# Patient Record
Sex: Male | Born: 1965 | Race: White | Hispanic: No | Marital: Married | Smoking: Never smoker
Health system: Southern US, Community
[De-identification: ages and names within clinical notes are randomized; demographics above are authoritative.]

## PROBLEM LIST (undated history)

## (undated) DIAGNOSIS — K5790 Diverticulosis of intestine, part unspecified, without perforation or abscess without bleeding: Secondary | ICD-10-CM

## (undated) DIAGNOSIS — R51 Headache: Secondary | ICD-10-CM

## (undated) DIAGNOSIS — Z87820 Personal history of traumatic brain injury: Secondary | ICD-10-CM

## (undated) DIAGNOSIS — E291 Testicular hypofunction: Secondary | ICD-10-CM

## (undated) DIAGNOSIS — R519 Headache, unspecified: Secondary | ICD-10-CM

## (undated) DIAGNOSIS — E119 Type 2 diabetes mellitus without complications: Secondary | ICD-10-CM

## (undated) DIAGNOSIS — J309 Allergic rhinitis, unspecified: Secondary | ICD-10-CM

## (undated) DIAGNOSIS — K7581 Nonalcoholic steatohepatitis (NASH): Secondary | ICD-10-CM

## (undated) DIAGNOSIS — E785 Hyperlipidemia, unspecified: Secondary | ICD-10-CM

## (undated) DIAGNOSIS — K219 Gastro-esophageal reflux disease without esophagitis: Secondary | ICD-10-CM

## (undated) DIAGNOSIS — K760 Fatty (change of) liver, not elsewhere classified: Secondary | ICD-10-CM

## (undated) DIAGNOSIS — N529 Male erectile dysfunction, unspecified: Secondary | ICD-10-CM

## (undated) DIAGNOSIS — G8929 Other chronic pain: Secondary | ICD-10-CM

## (undated) DIAGNOSIS — N401 Enlarged prostate with lower urinary tract symptoms: Secondary | ICD-10-CM

## (undated) DIAGNOSIS — N138 Other obstructive and reflux uropathy: Secondary | ICD-10-CM

## (undated) HISTORY — DX: Gastro-esophageal reflux disease without esophagitis: K21.9

## (undated) HISTORY — DX: Headache, unspecified: R51.9

## (undated) HISTORY — DX: Testicular hypofunction: E29.1

## (undated) HISTORY — DX: Allergic rhinitis, unspecified: J30.9

## (undated) HISTORY — DX: Headache: R51

## (undated) HISTORY — DX: Benign prostatic hyperplasia with lower urinary tract symptoms: N40.1

## (undated) HISTORY — DX: Diverticulosis of intestine, part unspecified, without perforation or abscess without bleeding: K57.90

## (undated) HISTORY — DX: Other obstructive and reflux uropathy: N13.8

## (undated) HISTORY — DX: Nonalcoholic steatohepatitis (NASH): K75.81

## (undated) HISTORY — DX: Hyperlipidemia, unspecified: E78.5

## (undated) HISTORY — DX: Personal history of traumatic brain injury: Z87.820

## (undated) HISTORY — DX: Other chronic pain: G89.29

## (undated) HISTORY — PX: MYELOGRAM: SHX5347

## (undated) HISTORY — DX: Male erectile dysfunction, unspecified: N52.9

## (undated) HISTORY — PX: NASAL SINUS SURGERY: SHX719

---

## 2005-01-09 ENCOUNTER — Ambulatory Visit: Payer: Self-pay | Admitting: Family Medicine

## 2005-05-05 ENCOUNTER — Other Ambulatory Visit: Payer: Self-pay

## 2005-05-05 ENCOUNTER — Emergency Department: Payer: Self-pay | Admitting: Internal Medicine

## 2006-01-09 ENCOUNTER — Ambulatory Visit: Payer: Self-pay | Admitting: Otolaryngology

## 2007-11-14 ENCOUNTER — Ambulatory Visit: Payer: Self-pay | Admitting: Internal Medicine

## 2008-03-31 ENCOUNTER — Ambulatory Visit: Payer: Self-pay | Admitting: Family Medicine

## 2008-07-17 ENCOUNTER — Ambulatory Visit: Payer: Self-pay | Admitting: Family Medicine

## 2009-06-10 HISTORY — PX: LIVER BIOPSY: SHX301

## 2012-01-06 DIAGNOSIS — E119 Type 2 diabetes mellitus without complications: Secondary | ICD-10-CM | POA: Insufficient documentation

## 2012-01-06 DIAGNOSIS — E781 Pure hyperglyceridemia: Secondary | ICD-10-CM | POA: Insufficient documentation

## 2012-01-07 DIAGNOSIS — E881 Lipodystrophy, not elsewhere classified: Secondary | ICD-10-CM | POA: Insufficient documentation

## 2012-01-07 DIAGNOSIS — K760 Fatty (change of) liver, not elsewhere classified: Secondary | ICD-10-CM | POA: Insufficient documentation

## 2012-01-07 DIAGNOSIS — K219 Gastro-esophageal reflux disease without esophagitis: Secondary | ICD-10-CM | POA: Insufficient documentation

## 2012-06-02 ENCOUNTER — Ambulatory Visit: Payer: Self-pay | Admitting: Internal Medicine

## 2012-06-15 LAB — CBC CANCER CENTER
Basophil #: 0 x10 3/mm (ref 0.0–0.1)
Basophil %: 0.5 %
HGB: 16.1 g/dL (ref 13.0–18.0)
Lymphocyte %: 31.9 %
MCH: 27.6 pg (ref 26.0–34.0)
MCHC: 33 g/dL (ref 32.0–36.0)
MCV: 84 fL (ref 80–100)
Monocyte #: 0.5 x10 3/mm (ref 0.2–1.0)
Neutrophil %: 47.8 %
Platelet: 162 x10 3/mm (ref 150–440)
RBC: 5.81 10*6/uL (ref 4.40–5.90)
RDW: 14.6 % — ABNORMAL HIGH (ref 11.5–14.5)
WBC: 4.9 x10 3/mm (ref 3.8–10.6)

## 2012-06-15 LAB — COMPREHENSIVE METABOLIC PANEL
Albumin: 4 g/dL (ref 3.4–5.0)
Anion Gap: 11 (ref 7–16)
Bilirubin,Total: 0.4 mg/dL (ref 0.2–1.0)
Calcium, Total: 8.5 mg/dL (ref 8.5–10.1)
Chloride: 103 mmol/L (ref 98–107)
Co2: 28 mmol/L (ref 21–32)
Creatinine: 1.25 mg/dL (ref 0.60–1.30)
EGFR (African American): >60
Total Protein: 7.5 g/dL (ref 6.4–8.2)

## 2012-06-15 LAB — IRON AND TIBC
Iron Saturation: 17 %
Unbound Iron-Bind.Cap.: 368 ug/dL

## 2012-06-15 LAB — FERRITIN: Ferritin (ARMC): 13 ng/mL (ref 8–388)

## 2012-06-15 LAB — LACTATE DEHYDROGENASE: LDH: 146 U/L (ref 87–241)

## 2012-06-21 ENCOUNTER — Ambulatory Visit: Payer: Self-pay | Admitting: Internal Medicine

## 2012-07-06 LAB — CBC CANCER CENTER
Basophil #: 0 x10 3/mm (ref 0.0–0.1)
Basophil %: 1 %
Eosinophil #: 0.1 x10 3/mm (ref 0.0–0.7)
Eosinophil %: 2.8 %
HCT: 48.1 % (ref 40.0–52.0)
HGB: 15.7 g/dL (ref 13.0–18.0)
Lymphocyte %: 32.3 %
MCH: 27.2 pg (ref 26.0–34.0)
Monocyte #: 0.4 x10 3/mm (ref 0.2–1.0)
Neutrophil %: 56.3 %
Platelet: 147 x10 3/mm — ABNORMAL LOW (ref 150–440)
RBC: 5.76 10*6/uL (ref 4.40–5.90)
WBC: 5.1 x10 3/mm (ref 3.8–10.6)

## 2012-07-22 ENCOUNTER — Ambulatory Visit: Payer: Self-pay | Admitting: Internal Medicine

## 2012-08-24 ENCOUNTER — Ambulatory Visit: Payer: Self-pay | Admitting: Internal Medicine

## 2012-08-24 LAB — DIFFERENTIAL
Basophil #: 0 10*3/uL (ref 0.0–0.1)
Eosinophil #: 0.1 10*3/uL (ref 0.0–0.7)
Lymphocyte #: 1.7 10*3/uL (ref 1.0–3.6)
Lymphocyte %: 32.9 %
Monocyte #: 0.4 x10 3/mm (ref 0.2–1.0)
Neutrophil #: 3 10*3/uL (ref 1.4–6.5)
Neutrophil %: 57.2 %

## 2012-09-21 ENCOUNTER — Ambulatory Visit: Payer: Self-pay | Admitting: Internal Medicine

## 2012-11-08 DIAGNOSIS — IMO0002 Reserved for concepts with insufficient information to code with codable children: Secondary | ICD-10-CM | POA: Insufficient documentation

## 2013-07-13 ENCOUNTER — Ambulatory Visit: Payer: Self-pay | Admitting: Family Medicine

## 2013-07-13 LAB — CBC WITH DIFFERENTIAL/PLATELET
Basophil %: 0.8 %
Eosinophil #: 0.2 10*3/uL (ref 0.0–0.7)
Eosinophil %: 1.9 %
HGB: 17.6 g/dL (ref 13.0–18.0)
Lymphocyte #: 1.6 10*3/uL (ref 1.0–3.6)
Lymphocyte %: 20.4 %
MCH: 27.6 pg (ref 26.0–34.0)
MCHC: 33.4 g/dL (ref 32.0–36.0)
Monocyte #: 0.8 x10 3/mm (ref 0.2–1.0)
Neutrophil #: 5.4 10*3/uL (ref 1.4–6.5)
Neutrophil %: 67.5 %
Platelet: 167 10*3/uL (ref 150–440)
RBC: 6.37 10*6/uL — ABNORMAL HIGH (ref 4.40–5.90)
WBC: 8 10*3/uL (ref 3.8–10.6)

## 2013-07-13 LAB — MONONUCLEOSIS SCREEN: Mono Test: NEGATIVE

## 2013-07-15 LAB — BETA STREP CULTURE(ARMC)

## 2013-07-17 ENCOUNTER — Ambulatory Visit: Payer: Self-pay | Admitting: Emergency Medicine

## 2014-03-29 DIAGNOSIS — E559 Vitamin D deficiency, unspecified: Secondary | ICD-10-CM | POA: Insufficient documentation

## 2014-10-13 DIAGNOSIS — M791 Myalgia, unspecified site: Secondary | ICD-10-CM | POA: Insufficient documentation

## 2015-04-18 DIAGNOSIS — Z79899 Other long term (current) drug therapy: Secondary | ICD-10-CM | POA: Insufficient documentation

## 2015-06-26 ENCOUNTER — Other Ambulatory Visit: Payer: Self-pay

## 2015-06-26 DIAGNOSIS — E291 Testicular hypofunction: Secondary | ICD-10-CM

## 2015-06-27 ENCOUNTER — Telehealth: Payer: Self-pay

## 2015-06-27 LAB — HEMATOCRIT: Hematocrit: 52.9 % — ABNORMAL HIGH (ref 37.5–51.0)

## 2015-06-27 LAB — TESTOSTERONE: TESTOSTERONE: 360 ng/dL (ref 348–1197)

## 2015-06-27 NOTE — Telephone Encounter (Signed)
-----   Message from Shannon A McGowan, PA-C sent at 06/27/2015  8:05 AM EDT ----- Patient's HCT is elevated.  He needs to have a therapeutic phlebotomy. 

## 2015-06-27 NOTE — Telephone Encounter (Signed)
LMOM

## 2015-06-27 NOTE — Telephone Encounter (Signed)
-----   Message from Harle BattiestShannon A McGowan, PA-C sent at 06/27/2015  8:05 AM EDT ----- Patient's HCT is elevated.  He needs to have a therapeutic phlebotomy.

## 2015-06-27 NOTE — Telephone Encounter (Signed)
Pt returned call. Made aware of lab results. Pt is going to give blood. Pt also asked if testosterone could be called into [facility] in [place]. Please advise. Cw,lpn

## 2015-06-28 ENCOUNTER — Ambulatory Visit
Admission: RE | Admit: 2015-06-28 | Discharge: 2015-06-28 | Disposition: A | Discharge status: Home / Self Care | Payer: BC Managed Care – PPO | Source: Ambulatory Visit | Attending: Urology | Admitting: Urology

## 2015-06-28 DIAGNOSIS — R7889 Finding of other specified substances, not normally found in blood: Secondary | ICD-10-CM | POA: Insufficient documentation

## 2015-06-28 LAB — HEMOGLOBIN AND HEMATOCRIT, BLOOD
HCT: 55.4 % — ABNORMAL HIGH (ref 40.0–52.0)
Hemoglobin: 18.3 g/dL — ABNORMAL HIGH (ref 13.0–18.0)

## 2015-06-28 NOTE — Telephone Encounter (Signed)
He cannot be on testosterone until his HCT is normalized.

## 2015-06-29 NOTE — Telephone Encounter (Signed)
Spoke with Jeffrey Cook in reference to testosterone. Jeffrey Cook voiced understanding. Jeffrey Cook stated he went to therapy yesterday and we should have labs results today. Made Jeffrey Cook aware once lab results are in and [name] gives permission his testosterone will be called in. Jeffrey Cook voiced understanding. Cw,lpn

## 2015-07-02 ENCOUNTER — Other Ambulatory Visit: Payer: Self-pay | Admitting: Family Medicine

## 2015-07-02 DIAGNOSIS — R718 Other abnormality of red blood cells: Secondary | ICD-10-CM

## 2015-07-03 ENCOUNTER — Other Ambulatory Visit: Payer: Self-pay

## 2015-07-09 ENCOUNTER — Ambulatory Visit
Admission: RE | Admit: 2015-07-09 | Discharge: 2015-07-09 | Disposition: A | Discharge status: Home / Self Care | Payer: BC Managed Care – PPO | Source: Ambulatory Visit | Attending: Urology | Admitting: Urology

## 2015-07-09 LAB — HEMOGLOBIN AND HEMATOCRIT, BLOOD
HCT: 48.4 % (ref 40.0–52.0)
Hemoglobin: 16.2 g/dL (ref 13.0–18.0)

## 2015-07-10 ENCOUNTER — Telehealth: Payer: Self-pay

## 2015-07-10 DIAGNOSIS — E291 Testicular hypofunction: Secondary | ICD-10-CM

## 2015-07-10 MED ORDER — TESTOSTERONE CYPIONATE 200 MG/ML IM SOLN: 200.0000 mg | INTRAMUSCULAR | Status: DC

## 2015-07-10 NOTE — Telephone Encounter (Signed)
-----   Message from Harle BattiestShannon A McGowan, PA-C sent at 07/09/2015  7:02 PM EDT ----- He needs to reduce his testosterone dose to 0.5cc weekly. We will recheck his  testosterone level before 9:00 am in one month. Okay to refill his testosterone at this time.

## 2015-07-10 NOTE — Telephone Encounter (Signed)
-----   Message from Shannon A McGowan, PA-C sent at 07/09/2015  7:02 PM EDT ----- He needs to reduce his testosterone dose to 0.5cc weekly. We will recheck his  testosterone level before 9:00 am in one month. Okay to refill his testosterone at this time. 

## 2015-07-10 NOTE — Telephone Encounter (Signed)
LMOM

## 2015-07-10 NOTE — Telephone Encounter (Signed)
Spoke with pt in reference to testosterone. Pt was not happy about coming to clinic for injections. New script was sent to pt pharmacy. Pt will return to clinic next week for injection.

## 2015-07-11 ENCOUNTER — Telehealth: Payer: Self-pay

## 2015-07-11 NOTE — Telephone Encounter (Signed)
Pt called stating he has been taking 0.705mL of testosterone for the past 3 months. Pt asked does he need to go down in dose, since you ordered 0.5 yesterday, or should he stay at the same dose. Please advise.

## 2015-07-11 NOTE — Telephone Encounter (Signed)
His dose will be 0.5 mL IM every 2 weeks.  This medication will need to be administered to the patient by us in the office.  This is in accordance with the states efforts to reduce the abuse of testosterone.

## 2015-07-12 NOTE — Telephone Encounter (Signed)
Spoke with pt and made aware of testosterone dosage and the need to come to the office for future injections. Pt voiced understanding.

## 2015-07-23 ENCOUNTER — Ambulatory Visit
Admission: EM | Admit: 2015-07-23 | Discharge: 2015-07-23 | Disposition: A | Discharge status: Home / Self Care | Payer: BC Managed Care – PPO | Attending: Family Medicine | Admitting: Family Medicine

## 2015-07-23 ENCOUNTER — Encounter: Payer: Self-pay | Admitting: Emergency Medicine

## 2015-07-23 DIAGNOSIS — J0100 Acute maxillary sinusitis, unspecified: Secondary | ICD-10-CM

## 2015-07-23 HISTORY — DX: Type 2 diabetes mellitus without complications: E11.9

## 2015-07-23 HISTORY — DX: Fatty (change of) liver, not elsewhere classified: K76.0

## 2015-07-23 MED ORDER — AMOXICILLIN-POT CLAVULANATE 875-125 MG PO TABS: 1.0000 | ORAL_TABLET | Freq: Two times a day (BID) | ORAL | Status: DC

## 2015-07-23 MED ORDER — FEXOFENADINE-PSEUDOEPHED ER 180-240 MG PO TB24: 1.0000 | ORAL_TABLET | Freq: Every day | ORAL | Status: DC

## 2015-07-23 MED ORDER — FLUTICASONE PROPIONATE 50 MCG/ACT NA SUSP: 2.0000 | Freq: Every day | NASAL | Status: DC

## 2015-07-23 NOTE — ED Notes (Signed)
Patient c/o cough, sinus pain and pressure since Wed.  Patient denies fevers.

## 2015-07-23 NOTE — Discharge Instructions (Signed)
Facial Infection You have an infection of your face. This requires special attention to help prevent serious problems. Infections in facial wounds can cause poor healing and scars. They can also spread to deeper tissues, especially around the eye. Wound and dental infections can lead to sinusitis, infection of the eye socket, and even meningitis. Permanent damage to the skin, eye, and nervous system may result if facial infections are not treated properly. With severe infections, hospital care for IV antibiotic injections may be needed if they don't respond to oral antibiotics. Antibiotics must be taken for the full course to insure the infection is eliminated. If the infection came from a bad tooth, it may have to be extracted when the infection is under control. Warm compresses may be applied to reduce skin irritation and remove drainage. You might need a tetanus shot now if:  You cannot remember when your last tetanus shot was.  You have never had a tetanus shot.  The object that caused your wound was dirty. If you need a tetanus shot, and you decide not to get one, there is a rare chance of getting tetanus. Sickness from tetanus can be serious. If you got a tetanus shot, your arm may swell, get red and warm to the touch at the shot site. This is common and not a problem. SEEK IMMEDIATE MEDICAL CARE IF:   You have increased swelling, redness, or trouble breathing.  You have a severe headache, dizziness, nausea, or vomiting.  You develop problems with your eyesight.  You have a fever. Document Released: 01/15/2005 Document Revised: 03/01/2012 Document Reviewed: 12/08/2005 [facility] Patient Information 2015 [place], [place]. This information is not intended to replace advice given to you by your health care provider. Make sure you discuss any questions you have with your health care provider.  Facial Infection You have an infection of your face. This requires special attention to help prevent  serious problems. Infections in facial wounds can cause poor healing and scars. They can also spread to deeper tissues, especially around the eye. Wound and dental infections can lead to sinusitis, infection of the eye socket, and even meningitis. Permanent damage to the skin, eye, and nervous system may result if facial infections are not treated properly. With severe infections, hospital care for IV antibiotic injections may be needed if they don't respond to oral antibiotics. Antibiotics must be taken for the full course to insure the infection is eliminated. If the infection came from a bad tooth, it may have to be extracted when the infection is under control. Warm compresses may be applied to reduce skin irritation and remove drainage. You might need a tetanus shot now if:  You cannot remember when your last tetanus shot was.  You have never had a tetanus shot.  The object that caused your wound was dirty. If you need a tetanus shot, and you decide not to get one, there is a rare chance of getting tetanus. Sickness from tetanus can be serious. If you got a tetanus shot, your arm may swell, get red and warm to the touch at the shot site. This is common and not a problem. SEEK IMMEDIATE MEDICAL CARE IF:   You have increased swelling, redness, or trouble breathing.  You have a severe headache, dizziness, nausea, or vomiting.  You develop problems with your eyesight.  You have a fever. Document Released: 01/15/2005 Document Revised: 03/01/2012 Document Reviewed: 12/08/2005 [facility] Patient Information 2015 [place], [place]. This information is not intended to replace advice given  to you by your health care provider. Make sure you discuss any questions you have with your health care provider.  Sinusitis Sinusitis is redness, soreness, and puffiness (inflammation) of the air pockets in the bones of your face (sinuses). The redness, soreness, and puffiness can cause air and mucus to get trapped  in your sinuses. This can allow germs to grow and cause an infection.  HOME CARE   Drink enough fluids to keep your pee (urine) clear or pale yellow.  Use a humidifier in your home.  Run a hot shower to create steam in the bathroom. Sit in the bathroom with the door closed. Breathe in the steam 3-4 times a day.  Put a warm, moist washcloth on your face 3-4 times a day, or as told by your doctor.  Use salt water sprays (saline sprays) to wet the thick fluid in your nose. This can help the sinuses drain.  Only take medicine as told by your doctor. GET HELP RIGHT AWAY IF:   Your pain gets worse.  You have very bad headaches.  You are sick to your stomach (nauseous).  You throw up (vomit).  You are very sleepy (drowsy) all the time.  Your face is puffy (swollen).  Your vision changes.  You have a stiff neck.  You have trouble breathing. MAKE SURE YOU:   Understand these instructions.  Will watch your condition.  Will get help right away if you are not doing well or get worse. Document Released: 05/26/2008 Document Revised: 09/01/2012 Document Reviewed: 07/13/2012 [facility] Patient Information 2015 [place], [place]. This information is not intended to replace advice given to you by your health care provider. Make sure you discuss any questions you have with your health care provider.

## 2015-07-23 NOTE — ED Provider Notes (Signed)
CSN: [id number]     Arrival date & time 07/23/15  1213 History   First MD Initiated Contact with Patient 07/23/15 1303     Chief Complaint  Patient presents with  . Facial Pain  . Cough   (Consider location/radiation/quality/duration/timing/severity/associated sxs/prior Treatment) Patient is a 49 y.o. male presenting with cough. The history is provided by the patient. No language interpreter was used.  Cough Cough characteristics:  Non-productive Severity:  Moderate Duration:  6 days Progression:  Waxing and waning Chronicity:  New Smoker: no   Context: upper respiratory infection   Relieved by:  Nothing Ineffective treatments:  None tried Associated symptoms: ear fullness, ear pain, rhinorrhea, sinus congestion and sore throat   Associated symptoms: no chest pain, no chills, no fever, no shortness of breath and no wheezing     Past Medical History  Diagnosis Date  . Diabetes mellitus without complication   . Fatty liver    History reviewed. No pertinent past surgical history. History reviewed. No pertinent family history. History  Substance Use Topics  . Smoking status: Former Games developer  . Smokeless tobacco: Never Used  . Alcohol Use: No    Review of Systems  Constitutional: Negative for fever and chills.  HENT: Positive for ear pain, rhinorrhea and sore throat.   Respiratory: Positive for cough. Negative for shortness of breath and wheezing.   Cardiovascular: Negative for chest pain.  All other systems reviewed and are negative.   Allergies  Prednisone  Home Medications   Prior to Admission medications   Medication Sig Start Date End Date Taking? Authorizing Provider  aspirin 81 MG tablet Take 81 mg by mouth daily.   Yes Historical Provider, MD  atorvastatin (LIPITOR) 10 MG tablet Take 10 mg by mouth daily.   Yes Historical Provider, MD  buPROPion (WELLBUTRIN SR) 150 MG 12 hr tablet Take 150 mg by mouth 2 (two) times daily.   Yes Historical Provider, MD   cholecalciferol (VITAMIN D) 400 UNITS TABS tablet Take 1,000 Units by mouth daily.   Yes Historical Provider, MD  fenofibrate 160 MG tablet Take 160 mg by mouth daily.   Yes Historical Provider, MD  lisinopril (PRINIVIL,ZESTRIL) 5 MG tablet Take 5 mg by mouth daily.   Yes Historical Provider, MD  metFORMIN (GLUCOPHAGE) 850 MG tablet Take 850 mg by mouth 2 (two) times daily with a meal.   Yes Historical Provider, MD  omega-3 acid ethyl esters (LOVAZA) 1 G capsule Take 1 g by mouth 2 (two) times daily.   Yes Historical Provider, MD  vitamin E 400 UNIT capsule Take 800 Units by mouth daily.   Yes Historical Provider, MD  testosterone cypionate (DEPOTESTOSTERONE CYPIONATE) 200 MG/ML injection Inject 1 mL (200 mg total) into the muscle every 14 (fourteen) days. 07/10/15   Carollee Herter A McGowan, PA-C   BP 126/83 mmHg  Pulse 78  Temp(Src) 97 F (36.1 C) (Tympanic)  Resp 16  Ht 5\' 11"  (1.803 m)  Wt 192 lb (87.091 kg)  BMI 26.79 kg/m2  SpO2 97% Physical Exam  Constitutional: He is oriented to person, place, and time. He appears well-developed and well-nourished. No distress.  HENT:  Head: Normocephalic.  Right Ear: Hearing, tympanic membrane and external ear normal.  Left Ear: Tympanic membrane is injected, erythematous and bulging.  Nose: Mucosal edema present. Right sinus exhibits maxillary sinus tenderness. Left sinus exhibits maxillary sinus tenderness.  Mouth/Throat: Mucous membranes are normal. Posterior oropharyngeal erythema present.  Eyes: Pupils are equal, round, and reactive to light.  Neck: Normal range of motion. Neck supple. No tracheal deviation present. No thyroid mass and no thyromegaly present.  Cardiovascular: Normal rate, regular rhythm and normal heart sounds.   No murmur heard. Pulmonary/Chest: Effort normal and breath sounds normal. No respiratory distress. He has no wheezes.  Musculoskeletal: Normal range of motion.  Lymphadenopathy:    He has cervical adenopathy.   Neurological: He is alert and oriented to person, place, and time.  Skin: Skin is warm. He is not diaphoretic.  Psychiatric: He has a normal mood and affect. His behavior is normal.    ED Course  Procedures (including critical care time) Labs Review Labs Reviewed - No data to display  Imaging Review No results found. Will treat w/antibiotic , decongestant and nasal spray  MDM  No diagnosis found.     Hassan Rowan, MD 07/23/15 [id number]

## 2015-08-01 ENCOUNTER — Other Ambulatory Visit: Payer: Self-pay

## 2015-08-01 DIAGNOSIS — E291 Testicular hypofunction: Secondary | ICD-10-CM

## 2015-08-01 MED ORDER — TESTOSTERONE CYPIONATE 200 MG/ML IM SOLN: 200.0000 mg | INTRAMUSCULAR | Status: DC

## 2015-08-03 ENCOUNTER — Ambulatory Visit: Payer: Self-pay

## 2015-08-06 ENCOUNTER — Ambulatory Visit (INDEPENDENT_AMBULATORY_CARE_PROVIDER_SITE_OTHER): Payer: BC Managed Care – PPO

## 2015-08-06 DIAGNOSIS — E291 Testicular hypofunction: Secondary | ICD-10-CM

## 2015-08-06 MED ORDER — TESTOSTERONE CYPIONATE 200 MG/ML IM SOLN
200.0000 mg | Freq: Once | INTRAMUSCULAR | Status: AC
  Administered 2015-08-06: 200 mg via INTRAMUSCULAR

## 2015-08-06 NOTE — Progress Notes (Signed)
Testosterone IM Injection  Due to Hypogonadism patient is present today for a Testosterone Injection.  Medication: Testosterone Cypionate Dose: 0.5mL Location: left upper outer buttocks Lot: [id number] Exp:02/2017  Patient tolerated well, no complications were noted  Preformed by: Dannilynn Gallina, LPN    

## 2015-08-10 ENCOUNTER — Other Ambulatory Visit: Payer: Self-pay

## 2015-08-10 ENCOUNTER — Other Ambulatory Visit: Payer: BC Managed Care – PPO

## 2015-08-13 ENCOUNTER — Other Ambulatory Visit: Payer: BC Managed Care – PPO

## 2015-08-17 ENCOUNTER — Other Ambulatory Visit (INDEPENDENT_AMBULATORY_CARE_PROVIDER_SITE_OTHER): Payer: BC Managed Care – PPO

## 2015-08-17 DIAGNOSIS — E291 Testicular hypofunction: Secondary | ICD-10-CM | POA: Diagnosis not present

## 2015-08-17 MED ORDER — TESTOSTERONE CYPIONATE 200 MG/ML IM SOLN
200.0000 mg | Freq: Once | INTRAMUSCULAR | Status: AC
  Administered 2015-08-17: 200 mg via INTRAMUSCULAR

## 2015-08-17 NOTE — Progress Notes (Signed)
Testosterone IM Injection  Due to Hypogonadism patient is present today for a Testosterone Injection.  Medication: Testosterone Cypionate Dose: 0.42mL Location: right upper outer buttocks Lot: [id number]  Exp:02/2017  Patient tolerated well, no complications were noted  Preformed by: K.russell, CMA  Follow up: 1 week

## 2015-08-24 ENCOUNTER — Other Ambulatory Visit (INDEPENDENT_AMBULATORY_CARE_PROVIDER_SITE_OTHER): Payer: BC Managed Care – PPO

## 2015-08-24 DIAGNOSIS — E291 Testicular hypofunction: Secondary | ICD-10-CM

## 2015-08-24 MED ORDER — TESTOSTERONE CYPIONATE 200 MG/ML IM SOLN
200.0000 mg | Freq: Once | INTRAMUSCULAR | Status: AC
  Administered 2015-08-24: 200 mg via INTRAMUSCULAR

## 2015-08-24 NOTE — Progress Notes (Signed)
Testosterone IM Injection  Due to Hypogonadism patient is present today for a Testosterone Injection.  Medication: Testosterone Cypionate Dose: 0.5mL Location: left upper outer buttocks Lot: [id number] Exp:02/2017  Patient tolerated well, no complications were noted  Preformed by: Akoni Parton, LPN    

## 2015-08-31 ENCOUNTER — Other Ambulatory Visit (INDEPENDENT_AMBULATORY_CARE_PROVIDER_SITE_OTHER): Payer: BC Managed Care – PPO

## 2015-08-31 ENCOUNTER — Other Ambulatory Visit: Payer: BC Managed Care – PPO

## 2015-08-31 DIAGNOSIS — E291 Testicular hypofunction: Secondary | ICD-10-CM | POA: Diagnosis not present

## 2015-08-31 DIAGNOSIS — R718 Other abnormality of red blood cells: Secondary | ICD-10-CM

## 2015-08-31 MED ORDER — TESTOSTERONE CYPIONATE 200 MG/ML IM SOLN
200.0000 mg | Freq: Once | INTRAMUSCULAR | Status: AC
  Administered 2015-08-31: 200 mg via INTRAMUSCULAR

## 2015-08-31 NOTE — Progress Notes (Signed)
Testosterone IM Injection  Due to Hypogonadism patient is present today for a Testosterone Injection.  Medication: Testosterone Cypionate Dose: 0.22mL Location: right upper outer buttocks Lot: [id number] Exp:02/2017  Patient tolerated well, no complications were noted  Preformed by: Rupert Stacks, LPN  Follow up: Pt had testosterone and hct drawn prior to injection today.

## 2015-09-01 LAB — HEMATOCRIT: Hematocrit: 48.8 % (ref 37.5–51.0)

## 2015-09-03 ENCOUNTER — Telehealth: Payer: Self-pay

## 2015-09-03 NOTE — Telephone Encounter (Signed)
-----   Message from Harle Battiest, PA-C sent at 09/02/2015  2:08 PM EDT ----- Patient's hematocrit has now decreased.  He will need to continue 0.5 cc injections weekly. He also needs an office visit with me in October. I will need a morning testosterone before 9 AM, PSA and hematocrit prior to his visit with me in October.

## 2015-09-03 NOTE — Telephone Encounter (Signed)
Pt is going on vacation in October and wanted to know if he could have his testosterone vial to take with him. Please advise.

## 2015-09-03 NOTE — Telephone Encounter (Signed)
I cannot give the okay for this.  We will have to ask one of the physicians.

## 2015-09-07 ENCOUNTER — Other Ambulatory Visit (INDEPENDENT_AMBULATORY_CARE_PROVIDER_SITE_OTHER): Payer: BC Managed Care – PPO

## 2015-09-07 DIAGNOSIS — E291 Testicular hypofunction: Secondary | ICD-10-CM

## 2015-09-07 MED ORDER — TESTOSTERONE CYPIONATE 200 MG/ML IM SOLN
200.0000 mg | Freq: Once | INTRAMUSCULAR | Status: AC
  Administered 2015-09-07: 200 mg via INTRAMUSCULAR

## 2015-09-07 NOTE — Progress Notes (Signed)
Testosterone IM Injection  Due to Hypogonadism patient is present today for a Testosterone Injection.  Medication: Testosterone Cypionate Dose: 0.5mL Location: left upper outer buttocks Lot: [id number] Exp:02/2017  Patient tolerated well, no complications were noted  Preformed by: Chelsea Watkins, LPN    

## 2015-09-14 ENCOUNTER — Other Ambulatory Visit (INDEPENDENT_AMBULATORY_CARE_PROVIDER_SITE_OTHER): Payer: BC Managed Care – PPO

## 2015-09-14 DIAGNOSIS — E291 Testicular hypofunction: Secondary | ICD-10-CM

## 2015-09-14 MED ORDER — TESTOSTERONE CYPIONATE 200 MG/ML IM SOLN
200.0000 mg | Freq: Once | INTRAMUSCULAR | Status: AC
  Administered 2015-09-14: 200 mg via INTRAMUSCULAR

## 2015-09-14 NOTE — Progress Notes (Signed)
Testosterone IM Injection  Due to Hypogonadism patient is present today for a Testosterone Injection.  Medication: Testosterone Cypionate Dose: 0.5mL Location: right upper outer buttocks Lot: [id number]  Exp:02/2017  Patient tolerated well, no complications were noted  Preformed by: Chelsea Watkins, LPN 

## 2015-09-21 ENCOUNTER — Encounter: Payer: Self-pay | Admitting: *Deleted

## 2015-09-21 ENCOUNTER — Other Ambulatory Visit (INDEPENDENT_AMBULATORY_CARE_PROVIDER_SITE_OTHER): Payer: BC Managed Care – PPO

## 2015-09-21 DIAGNOSIS — E291 Testicular hypofunction: Secondary | ICD-10-CM

## 2015-09-21 MED ORDER — TESTOSTERONE CYPIONATE 200 MG/ML IM SOLN
200.0000 mg | Freq: Once | INTRAMUSCULAR | Status: AC
  Administered 2015-09-21: 200 mg via INTRAMUSCULAR

## 2015-09-21 NOTE — Progress Notes (Signed)
Testosterone IM Injection  Due to Hypogonadism patient is present today for a Testosterone Injection.  Medication: Testosterone Cypionate Dose: 0.43mL Location: right upper outer buttocks Lot: [id number]  Exp:02/2017  Patient tolerated well, no complications were noted  Preformed by: Rupert Stacks, LPN

## 2015-09-23 ENCOUNTER — Telehealth: Payer: Self-pay | Admitting: Urology

## 2015-09-23 NOTE — Telephone Encounter (Signed)
Patient will be due for his office visit and morning blood work (before 9 AM) of serum total testosterone, PSA and HCT this month.

## 2015-09-24 NOTE — Telephone Encounter (Signed)
The pt currently already got an appt scheduled with you this month for office visit and bloodwork.

## 2015-09-28 ENCOUNTER — Other Ambulatory Visit: Payer: BC Managed Care – PPO

## 2015-10-02 ENCOUNTER — Encounter: Payer: Self-pay | Admitting: *Deleted

## 2015-10-05 ENCOUNTER — Other Ambulatory Visit: Payer: BC Managed Care – PPO

## 2015-10-08 ENCOUNTER — Other Ambulatory Visit: Payer: BC Managed Care – PPO

## 2015-10-08 DIAGNOSIS — R972 Elevated prostate specific antigen [PSA]: Secondary | ICD-10-CM

## 2015-10-08 DIAGNOSIS — E291 Testicular hypofunction: Secondary | ICD-10-CM

## 2015-10-09 LAB — PSA: Prostate Specific Ag, Serum: 0.2 ng/mL (ref 0.0–4.0)

## 2015-10-09 LAB — TESTOSTERONE: Testosterone: 480 ng/dL (ref 348–1197)

## 2015-10-09 LAB — HEMATOCRIT: Hematocrit: 48 % (ref 37.5–51.0)

## 2015-10-12 ENCOUNTER — Ambulatory Visit: Payer: BC Managed Care – PPO | Admitting: Urology

## 2015-10-12 ENCOUNTER — Ambulatory Visit (INDEPENDENT_AMBULATORY_CARE_PROVIDER_SITE_OTHER): Payer: BC Managed Care – PPO | Admitting: Urology

## 2015-10-12 ENCOUNTER — Encounter: Payer: Self-pay | Admitting: Urology

## 2015-10-12 ENCOUNTER — Other Ambulatory Visit: Payer: BC Managed Care – PPO

## 2015-10-12 VITALS — BP 115/74 | HR 66 | Ht 71.5 in | Wt 194.6 lb

## 2015-10-12 DIAGNOSIS — N138 Other obstructive and reflux uropathy: Secondary | ICD-10-CM

## 2015-10-12 DIAGNOSIS — N401 Enlarged prostate with lower urinary tract symptoms: Secondary | ICD-10-CM

## 2015-10-12 DIAGNOSIS — N481 Balanitis: Secondary | ICD-10-CM

## 2015-10-12 DIAGNOSIS — E291 Testicular hypofunction: Secondary | ICD-10-CM | POA: Diagnosis not present

## 2015-10-12 MED ORDER — TESTOSTERONE CYPIONATE 200 MG/ML IM SOLN: 200.0000 mg | INTRAMUSCULAR | Status: DC

## 2015-10-12 MED ORDER — NYSTATIN 100000 UNIT/GM EX CREA: 1.0000 "application " | TOPICAL_CREAM | Freq: Two times a day (BID) | CUTANEOUS | Status: DC

## 2015-10-12 MED ORDER — TESTOSTERONE CYPIONATE 200 MG/ML IM SOLN
200.0000 mg | Freq: Once | INTRAMUSCULAR | Status: AC
  Administered 2015-10-12: 200 mg via INTRAMUSCULAR

## 2015-10-12 NOTE — Progress Notes (Signed)
10/12/2015 12:02 PM   Jeffrey Dexteraniel Jensen Jr. 1966-08-20 [id number]  Referring provider: No referring provider defined for this encounter.  Chief Complaint  Patient presents with  . Hypogonadism    follow up on testosterone    HPI: Patient is a 49 year old white male with hypogonadism and BPH with LUTS who presents today for 6 month follow-up.  Hypogonadism Patient is with the symptoms of reduced libido and erectile dysfunction. He is also experiencing a reduced incidence of spontaneous erections, a decreased energy and motivation.  This is indicated by his responses to the ADAM questionnaire.  His pretreatment testosterone level was 273 ng/dL on 40/98/119103/26/2013.  He is currently managing his hypogonadism with testosterone cypionate 0.5 mg every week.  His FSH, LH and prolactin were normal.        Androgen Deficiency in the Aging Male      10/12/15 1000       Androgen Deficiency in the Aging Male   Do you have a decrease in libido (sex drive) No     Do you have lack of energy Yes     Do you have a decrease in strength and/or endurance Yes     Have you lost height No     Have you noticed a decreased "enjoyment of life" No     Are you sad and/or grumpy No     Are your erections less strong No     Have you noticed a recent deterioration in your ability to play sports No     Are you falling asleep after dinner No     Has there been a recent deterioration in your work performance No        BPH WITH LUTS His IPSS score today is 5, which is mild lower urinary tract symptomatology. He is delighted with his quality life due to his urinary symptoms.  His major complaint today  urgency.  He has had these symptoms for several months.  He denies any dysuria, hematuria or suprapubic pain.   He also denies any recent fevers, chills, nausea or vomiting.  He does not have a family history of PCa.      IPSS      10/12/15 1000       International Prostate Symptom Score   How often have you  had the sensation of not emptying your bladder? Not at All     How often have you had to urinate less than every two hours? Less than 1 in 5 times     How often have you found you stopped and started again several times when you urinated? Not at All     How often have you found it difficult to postpone urination? About half the time     How often have you had a weak urinary stream? Not at All     How often have you had to strain to start urination? Not at All     How many times did you typically get up at night to urinate? 1 Time     Total IPSS Score 5     Quality of Life due to urinary symptoms   If you were to spend the rest of your life with your urinary condition just the way it is now how would you feel about that? Delighted        Score:  1-7 Mild 8-19 Moderate 20-35 Severe  Penile rash Patient has noted a rash on the head of his penis about  the size of the thumb print. He first noticed it one month and half ago.  It has not changed since its discovery. He states it is not itchy, scaly or had blisters on it. He also states that he does not have any pain.  It does not interfere with intercourse or urination.  PMH: Past Medical History  Diagnosis Date  . Diabetes mellitus without complication (HCC)   . Fatty liver   . Steatohepatitis, non-alcoholic   . Personal history of multiple concussions   . Dyslipidemia   . GERD (gastroesophageal reflux disease)   . Chronic headaches   . Diverticulosis   . Allergic rhinitis   . Hypogonadism in male   . BPH with obstruction/lower urinary tract symptoms   . Erectile dysfunction     Surgical History: Past Surgical History  Procedure Laterality Date  . Nasal sinus surgery    . Myelogram    . Liver biopsy  06/10/2009  . Triple bypass      Home Medications:    Medication List       This list is accurate as of: 10/12/15 12:02 PM.  Always use your most recent med list.               amoxicillin-clavulanate 875-125 MG tablet   Commonly known as:  AUGMENTIN  Take 1 tablet by mouth 2 (two) times daily.     aspirin 81 MG tablet  Take 81 mg by mouth daily.     atorvastatin 10 MG tablet  Commonly known as:  LIPITOR  Take 10 mg by mouth daily.     cholecalciferol 400 UNITS Tabs tablet  Commonly known as:  VITAMIN D  Take 1,000 Units by mouth daily.     fenofibrate 160 MG tablet  Take 160 mg by mouth daily.     fexofenadine-pseudoephedrine 180-240 MG 24 hr tablet  Commonly known as:  ALLEGRA-D ALLERGY & CONGESTION  Take 1 tablet by mouth daily.     fluticasone 50 MCG/ACT nasal spray  Commonly known as:  FLONASE  Place 2 sprays into both nostrils daily.     hydrocortisone cream-nystatin cream-zinc oxide  Apply 1 application topically 2 (two) times daily.     lisinopril 5 MG tablet  Commonly known as:  PRINIVIL,ZESTRIL  Take 5 mg by mouth daily.     metFORMIN 850 MG tablet  Commonly known as:  GLUCOPHAGE  Take 850 mg by mouth 2 (two) times daily with a meal.     omega-3 acid ethyl esters 1 G capsule  Commonly known as:  LOVAZA  Take 1 g by mouth 2 (two) times daily.     testosterone cypionate 200 MG/ML injection  Commonly known as:  DEPOTESTOSTERONE CYPIONATE  Inject 1 mL (200 mg total) into the muscle every 14 (fourteen) days.     vitamin E 400 UNIT capsule  Take 800 Units by mouth daily.        Allergies:  Allergies  Allergen Reactions  . Codeine   . Fish Oil     Other reaction(s): Other (See Comments) Dyspepsia, foul taste, loose stools - tolerates Lovaza  . Niacin And Related   . Prednisone     Headache  . Tricor [Fenofibrate]     Family History: Family History  Problem Relation Age of Onset  . Hypertension Mother   . Transient ischemic attack Father   . Alcohol abuse Mother   . Diabetes Mother   . Hyperlipidemia Father   . Melanoma Father   .  Heart attack Father   . Stroke Mother     Social History:  reports that he has never smoked. He has never used smokeless  tobacco. He reports that he drinks alcohol. He reports that he does not use illicit drugs.  ROS: UROLOGY Frequent Urination?: No Hard to postpone urination?: No Burning/pain with urination?: No Get up at night to urinate?: Yes Leakage of urine?: No Urine stream starts and stops?: No Trouble starting stream?: No Do you have to strain to urinate?: No Blood in urine?: No Urinary tract infection?: No Sexually transmitted disease?: No Injury to kidneys or bladder?: No Painful intercourse?: No Weak stream?: No Erection problems?: No Penile pain?: No  Gastrointestinal Nausea?: No Vomiting?: No Indigestion/heartburn?: No Diarrhea?: No Constipation?: No  Constitutional Fever: No Night sweats?: No Weight loss?: No Fatigue?: No  Skin Skin rash/lesions?: No Itching?: No  Eyes Blurred vision?: No Double vision?: No  Ears/Nose/Throat Sore throat?: No Sinus problems?: Yes  Hematologic/Lymphatic Swollen glands?: No Easy bruising?: No  Cardiovascular Leg swelling?: No Chest pain?: No  Respiratory Cough?: No Shortness of breath?: No  Endocrine Excessive thirst?: No  Musculoskeletal Back pain?: No Joint pain?: No  Neurological Headaches?: No Dizziness?: No  Psychologic Depression?: No Anxiety?: No  Physical Exam: BP 115/74 mmHg  Pulse 66  Ht 5' 11.5" (1.816 m)  Wt 194 lb 9.6 oz (88.27 kg)  BMI 26.77 kg/m2  GU: Patient with circumcised phallus. Balanitis.  Urethral meatus is patent.  No penile discharge. No penile lesions or rashes. Scrotum without lesions, cysts, rashes and/or edema.  Testicles are located scrotally bilaterally. No masses are appreciated in the testicles. Left and right epididymis are normal. Rectal: Patient with  normal sphincter tone. Perineum without scarring or rashes. No rectal masses are appreciated. Prostate is approximately 45 grams, no nodules are appreciated. Seminal vesicles are normal.   Laboratory Data: Lab Results    Component Value Date   WBC 8.0 07/13/2013   HGB 16.2 07/09/2015   HCT 48.0 10/08/2015   MCV 83 07/13/2013   PLT 167 07/13/2013    Lab Results  Component Value Date   CREATININE 1.25 06/15/2012   PSA History:  0.2 ng/mL on 01/25/2013  0.2 ng/mL on 03/10/2014  0.3 ng/mL on 08/10/2014 Lab Results  Component Value Date   PSA 0.2 10/08/2015    Lab Results  Component Value Date   TESTOSTERONE 480 10/08/2015     Assessment & Plan:    1. BPH with LUTS:   Patient's IPSS score is 5/0.   He is experiencing some mild urgency.  He does not want to start any medication at this time.  He will follow up in 6 months for a PSA, exam and an IPSS score.    2. Hypogonadism:   Patient is currently managing his hypogonadism with 0.5 mL every week of testosterone cypionate /IM.  This is working well for him.  He will continue this dose.  He did need a refill at this time.  I have sent one to his pharmacy.  He will have his morning serum testosterone drawn in 3 months before 9 AM and again in 6 months with a HCT.     3. Balanitis:  Patient is prescribed Mycolog II cream to apply twice daily to the area.  He will update [place] if he sees no improvement.      Return in about 1 week (around 10/19/2015) for depot testosterone injection.  Michiel Cowboy, PA-C  [facility] [address]  [address] [place], [place] [address] [phone]

## 2015-10-12 NOTE — Progress Notes (Signed)
Testosterone IM Injection  Due to Hypogonadism patient is present today for a Testosterone Injection.  Medication: Testosterone Cypionate Dose: 0.475mL Location: left upper outer buttocks Lot: [id number] Exp:02/2017  Patient tolerated well, no complications were noted  Preformed by: Rupert Stackshelsea Cordera Stineman, LPN

## 2015-10-16 ENCOUNTER — Other Ambulatory Visit: Payer: Self-pay

## 2015-10-16 DIAGNOSIS — N481 Balanitis: Secondary | ICD-10-CM

## 2015-10-16 MED ORDER — NYSTATIN-TRIAMCINOLONE 100000-0.1 UNIT/GM-% EX OINT: 1.0000 "application " | TOPICAL_OINTMENT | Freq: Two times a day (BID) | CUTANEOUS | Status: DC

## 2015-10-17 DIAGNOSIS — E291 Testicular hypofunction: Secondary | ICD-10-CM | POA: Insufficient documentation

## 2015-10-17 DIAGNOSIS — N138 Other obstructive and reflux uropathy: Secondary | ICD-10-CM | POA: Insufficient documentation

## 2015-10-17 DIAGNOSIS — N401 Enlarged prostate with lower urinary tract symptoms: Secondary | ICD-10-CM

## 2015-10-17 DIAGNOSIS — N481 Balanitis: Secondary | ICD-10-CM | POA: Insufficient documentation

## 2015-10-19 ENCOUNTER — Other Ambulatory Visit: Payer: BC Managed Care – PPO

## 2015-10-22 ENCOUNTER — Telehealth: Payer: Self-pay | Admitting: Radiology

## 2015-10-22 NOTE — Telephone Encounter (Signed)
Pt asks how long he should use the Mycolog cream.  When should he expect to see an improvement? Pt states the medication only says to apply bid, doesn't say for how long he should use it. Please advise.   See Shannon's note below:  3. Balanitis: Patient is prescribed Mycolog II cream to apply twice daily to the area. He will update us if he sees no improvement.

## 2015-10-23 NOTE — Telephone Encounter (Signed)
Please advise 

## 2015-10-23 NOTE — Telephone Encounter (Signed)
He should of seen some improvement by now.  Is the rash still present?  If so, he may benefit from seeing a dermatologist.  If the dermatologist can readily identify the lesion, he will not need a biopsy.

## 2015-10-23 NOTE — Telephone Encounter (Signed)
Spoke with pt in reference to rash and medication. Pt stated he just started using the medication Sunday night but he already has seen an improvement. Pt declined a dermatology appt at this time.

## 2015-10-26 ENCOUNTER — Other Ambulatory Visit: Payer: BC Managed Care – PPO

## 2015-11-02 ENCOUNTER — Other Ambulatory Visit: Payer: BC Managed Care – PPO

## 2015-11-06 ENCOUNTER — Ambulatory Visit: Payer: Self-pay | Admitting: Urology

## 2015-11-09 ENCOUNTER — Other Ambulatory Visit: Payer: BC Managed Care – PPO

## 2015-11-22 ENCOUNTER — Telehealth: Payer: Self-pay | Admitting: Urology

## 2015-11-22 DIAGNOSIS — E291 Testicular hypofunction: Secondary | ICD-10-CM

## 2015-11-22 NOTE — Telephone Encounter (Signed)
Can pt have refills?  

## 2015-11-22 NOTE — Telephone Encounter (Signed)
That will be fine.  He will need a HCT added to his next testosterone level.

## 2015-11-22 NOTE — Telephone Encounter (Signed)
Pt needs his testosterone (10 ml vial) called into [facility] in [place].  Pt's cell# [phone]

## 2015-11-23 ENCOUNTER — Other Ambulatory Visit: Payer: BC Managed Care – PPO

## 2015-11-23 MED ORDER — TESTOSTERONE CYPIONATE 200 MG/ML IM SOLN: 200.0000 mg | INTRAMUSCULAR | Status: DC

## 2015-11-23 NOTE — Telephone Encounter (Signed)
No vm. Needs lab appt in Jan. Testosterone refilled.

## 2015-11-30 ENCOUNTER — Other Ambulatory Visit: Payer: BC Managed Care – PPO

## 2015-12-07 ENCOUNTER — Other Ambulatory Visit: Payer: BC Managed Care – PPO

## 2015-12-21 ENCOUNTER — Other Ambulatory Visit: Payer: BC Managed Care – PPO

## 2015-12-31 ENCOUNTER — Other Ambulatory Visit: Payer: Self-pay

## 2016-01-07 ENCOUNTER — Other Ambulatory Visit: Payer: BC Managed Care – PPO

## 2016-01-07 DIAGNOSIS — E291 Testicular hypofunction: Secondary | ICD-10-CM

## 2016-01-08 LAB — TESTOSTERONE: Testosterone: 566 ng/dL (ref 348–1197)

## 2016-01-08 LAB — HEMATOCRIT: Hematocrit: 49.1 % (ref 37.5–51.0)

## 2016-01-09 ENCOUNTER — Telehealth: Payer: Self-pay

## 2016-01-09 NOTE — Telephone Encounter (Signed)
Pt called this morning wanting testosterone and hct results. Please advise.

## 2016-01-09 NOTE — Telephone Encounter (Signed)
-----   Message from Harle Battiest, PA-C sent at 01/09/2016  1:10 PM EST ----- Patient's labs are good.  We will see him in April.

## 2016-01-09 NOTE — Telephone Encounter (Signed)
Spoke with pt in reference to labs. Pt voiced understanding.

## 2016-02-08 ENCOUNTER — Telehealth: Payer: Self-pay

## 2016-02-08 DIAGNOSIS — E291 Testicular hypofunction: Secondary | ICD-10-CM

## 2016-02-08 NOTE — Telephone Encounter (Signed)
Spoke with pt in reference to therapeutic phlebotomy. Made pt aware he can try nestesto. Pt stated that with his sinus trouble he would rather stick with the injections and have to go to [facility]. Gave pt [facility] number. Orders placed.

## 2016-02-14 ENCOUNTER — Ambulatory Visit
Admission: RE | Admit: 2016-02-14 | Discharge: 2016-02-14 | Disposition: A | Discharge status: Home / Self Care | Payer: BC Managed Care – PPO | Source: Ambulatory Visit | Attending: Urology | Admitting: Urology

## 2016-02-14 LAB — HEMOGLOBIN AND HEMATOCRIT, BLOOD
HEMATOCRIT: 47.6 % (ref 40.0–52.0)
Hemoglobin: 16.1 g/dL (ref 13.0–18.0)

## 2016-02-15 ENCOUNTER — Telehealth: Payer: Self-pay

## 2016-02-15 NOTE — Telephone Encounter (Signed)
Spoke with pt in reference to hct and testosterone injections. Pt stated that once he runs out of current medication he will return to office for injections.

## 2016-02-15 NOTE — Telephone Encounter (Signed)
-----   Message from Harle Battiest, PA-C sent at 02/14/2016 11:30 PM EST ----- Patient's HCT is normal.  He has an upcoming appointment in April.  As of January, our office policy is that testosterone cypionate must be administered by our office.  We cannot allow patient's to self inject due to new FDA regulations.  He will need to schedule his injections with [place].

## 2016-03-07 ENCOUNTER — Telehealth: Payer: Self-pay | Admitting: Urology

## 2016-03-07 DIAGNOSIS — E291 Testicular hypofunction: Secondary | ICD-10-CM

## 2016-03-07 NOTE — Telephone Encounter (Signed)
Pt would like to be put back on Friday afternoons at 4 for injections.  He also would like his testosterone called in to [facility] in [place].  He will be out after today.

## 2016-03-12 MED ORDER — TESTOSTERONE CYPIONATE 200 MG/ML IM SOLN: 200.0000 mg | INTRAMUSCULAR | Status: DC

## 2016-03-12 NOTE — Telephone Encounter (Signed)
Medication printed and faxed. Pt made aware.

## 2016-03-12 NOTE — Telephone Encounter (Signed)
Can pt have refills?  

## 2016-03-12 NOTE — Telephone Encounter (Signed)
Yes

## 2016-03-14 ENCOUNTER — Ambulatory Visit (INDEPENDENT_AMBULATORY_CARE_PROVIDER_SITE_OTHER): Payer: BC Managed Care – PPO

## 2016-03-14 DIAGNOSIS — E291 Testicular hypofunction: Secondary | ICD-10-CM

## 2016-03-14 MED ORDER — TESTOSTERONE CYPIONATE 200 MG/ML IM SOLN
200.0000 mg | Freq: Once | INTRAMUSCULAR | Status: AC
  Administered 2016-03-14: 200 mg via INTRAMUSCULAR

## 2016-03-14 NOTE — Progress Notes (Signed)
Testosterone IM Injection  Due to Hypogonadism patient is present today for a Testosterone Injection.  Medication: Testosterone Cypionate Dose: 1mL Location: right upper outer buttocks Lot: [id number] Exp:09/2017  Patient tolerated well, no complications were noted  Preformed by: Rupert Stackshelsea Taimane Stimmel, LPN   Follow up: 2 weeks

## 2016-03-21 ENCOUNTER — Ambulatory Visit (INDEPENDENT_AMBULATORY_CARE_PROVIDER_SITE_OTHER): Payer: BC Managed Care – PPO

## 2016-03-21 DIAGNOSIS — E291 Testicular hypofunction: Secondary | ICD-10-CM

## 2016-03-21 MED ORDER — TESTOSTERONE CYPIONATE 200 MG/ML IM SOLN
200.0000 mg | Freq: Once | INTRAMUSCULAR | Status: AC
  Administered 2016-03-21: 200 mg via INTRAMUSCULAR

## 2016-03-21 NOTE — Progress Notes (Signed)
Testosterone IM Injection  Due to Hypogonadism patient is present today for a Testosterone Injection.  Medication: Testosterone Cypionate Dose: 1mL Location: left upper outer buttocks Lot: [id number] Exp:08/2017  Patient tolerated well, no complications were noted  Preformed by: Rupert Stackshelsea Abrea Henle, LPN

## 2016-03-23 DIAGNOSIS — J3089 Other allergic rhinitis: Secondary | ICD-10-CM | POA: Insufficient documentation

## 2016-03-28 ENCOUNTER — Ambulatory Visit (INDEPENDENT_AMBULATORY_CARE_PROVIDER_SITE_OTHER): Payer: BC Managed Care – PPO

## 2016-03-28 DIAGNOSIS — E291 Testicular hypofunction: Secondary | ICD-10-CM

## 2016-03-28 MED ORDER — TESTOSTERONE CYPIONATE 200 MG/ML IM SOLN
200.0000 mg | Freq: Once | INTRAMUSCULAR | Status: AC
  Administered 2016-03-28: 200 mg via INTRAMUSCULAR

## 2016-03-28 NOTE — Progress Notes (Signed)
Testosterone IM Injection  Due to Hypogonadism patient is present today for a Testosterone Injection.  Medication: Testosterone Cypionate Dose: 0.305mL Location: right upper outer buttocks Lot: [id number] Exp:08/2017  Patient tolerated well, no complications were noted  Preformed by: Rupert Stackshelsea Vanden Fawaz, LPN   Follow up: 1 weeks

## 2016-04-03 ENCOUNTER — Ambulatory Visit: Payer: BC Managed Care – PPO

## 2016-04-07 ENCOUNTER — Other Ambulatory Visit: Payer: Self-pay

## 2016-04-07 ENCOUNTER — Ambulatory Visit (INDEPENDENT_AMBULATORY_CARE_PROVIDER_SITE_OTHER): Payer: BC Managed Care – PPO

## 2016-04-07 DIAGNOSIS — E291 Testicular hypofunction: Secondary | ICD-10-CM

## 2016-04-07 DIAGNOSIS — Z79899 Other long term (current) drug therapy: Secondary | ICD-10-CM

## 2016-04-07 MED ORDER — TESTOSTERONE CYPIONATE 200 MG/ML IM SOLN
200.0000 mg | Freq: Once | INTRAMUSCULAR | Status: AC
  Administered 2016-04-07: 200 mg via INTRAMUSCULAR

## 2016-04-07 NOTE — Progress Notes (Signed)
Testosterone IM Injection  Due to Hypogonadism patient is present today for a Testosterone Injection.  Medication: Testosterone Cypionate Dose: 0.805mL Location: left upper outer buttocks Lot: [id number] Exp:08/2017  Patient tolerated well, no complications were noted  Preformed by: Rupert Stackshelsea Julea Hutto, LPN   [facility]

## 2016-04-08 LAB — TESTOSTERONE: Testosterone: 429 ng/dL (ref 348–1197)

## 2016-04-08 LAB — HEPATIC FUNCTION PANEL
ALT: 23 IU/L (ref 0–44)
AST: 21 IU/L (ref 0–40)
Albumin: 4.2 g/dL (ref 3.5–5.5)
Alkaline Phosphatase: 44 IU/L (ref 39–117)
BILIRUBIN, DIRECT: 0.1 mg/dL (ref 0.00–0.40)
Bilirubin Total: 0.3 mg/dL (ref 0.0–1.2)
TOTAL PROTEIN: 6.5 g/dL (ref 6.0–8.5)

## 2016-04-08 LAB — LIPID PANEL
CHOL/HDL RATIO: 4.6 ratio (ref 0.0–5.0)
Cholesterol, Total: 139 mg/dL (ref 100–199)
HDL: 30 mg/dL — ABNORMAL LOW (ref >39)
LDL CALC: 68 mg/dL (ref 0–99)
TRIGLYCERIDES: 203 mg/dL — AB (ref 0–149)
VLDL CHOLESTEROL CAL: 41 mg/dL — AB (ref 5–40)

## 2016-04-08 LAB — ESTRADIOL: Estradiol: 14.7 pg/mL (ref 7.6–42.6)

## 2016-04-08 LAB — HEMATOCRIT: Hematocrit: 46.7 % (ref 37.5–51.0)

## 2016-04-08 LAB — PSA: Prostate Specific Ag, Serum: 0.3 ng/mL (ref 0.0–4.0)

## 2016-04-08 LAB — TSH: TSH: 1.2 u[IU]/mL (ref 0.450–4.500)

## 2016-04-11 ENCOUNTER — Ambulatory Visit (INDEPENDENT_AMBULATORY_CARE_PROVIDER_SITE_OTHER): Payer: BC Managed Care – PPO | Admitting: Urology

## 2016-04-11 ENCOUNTER — Encounter: Payer: Self-pay | Admitting: Urology

## 2016-04-11 VITALS — BP 130/80 | HR 78 | Ht 71.0 in | Wt 200.0 lb

## 2016-04-11 DIAGNOSIS — F528 Other sexual dysfunction not due to a substance or known physiological condition: Secondary | ICD-10-CM | POA: Diagnosis not present

## 2016-04-11 DIAGNOSIS — E291 Testicular hypofunction: Secondary | ICD-10-CM

## 2016-04-11 DIAGNOSIS — N401 Enlarged prostate with lower urinary tract symptoms: Secondary | ICD-10-CM

## 2016-04-11 DIAGNOSIS — F5221 Male erectile disorder: Secondary | ICD-10-CM

## 2016-04-11 DIAGNOSIS — N138 Other obstructive and reflux uropathy: Secondary | ICD-10-CM

## 2016-04-11 MED ORDER — TESTOSTERONE 4 MG/24HR TD PT24: 1.0000 | MEDICATED_PATCH | Freq: Every day | TRANSDERMAL | Status: DC

## 2016-04-11 MED ORDER — TESTOSTERONE CYPIONATE 200 MG/ML IM SOLN
200.0000 mg | Freq: Once | INTRAMUSCULAR | Status: AC
  Administered 2016-04-11: 200 mg via INTRAMUSCULAR

## 2016-04-11 NOTE — Progress Notes (Signed)
9:02 PM   Jeffrey Cook. 05-08-1966 [id number]  Referring provider: No referring provider defined for this encounter.  Chief Complaint  Patient presents with  . Follow-up    hypogonadism    HPI: Patient is a 51 year old white male with hypogonadism and BPH with LUTS who presents today for 6 month follow-up.  Hypogonadism Patient is with the symptoms of A lack of energy, a decrease in strength and/or endurance and the loss of height   This is indicated by his responses to the ADAM questionnaire.  His pretreatment testosterone level was 273 ng/dL on 40/98/1191.  He is currently managing his hypogonadism with testosterone cypionate 0.5 mg every week.  His FSH, LH and prolactin were normal.        Androgen Deficiency in the Aging Male      04/11/16 1100       Androgen Deficiency in the Aging Male   Do you have a decrease in libido (sex drive) No     Do you have lack of energy Yes     Do you have a decrease in strength and/or endurance Yes     Have you lost height Yes     Have you noticed a decreased "enjoyment of life" No     Are you sad and/or grumpy No     Are your erections less strong No     Have you noticed a recent deterioration in your ability to play sports No     Are you falling asleep after dinner No     Has there been a recent deterioration in your work performance No        BPH WITH LUTS His IPSS score today is 4, which is mild lower urinary tract symptomatology. He is Pleased with his quality life due to his urinary symptoms.  His major complaint today  urgency.  He has had these symptoms for several months.  He denies any dysuria, hematuria or suprapubic pain.   He also denies any recent fevers, chills, nausea or vomiting.  He does not have a family history of PCa.      IPSS      04/11/16 1100       International Prostate Symptom Score   How often have you had the sensation of not emptying your bladder? Less than 1 in 5     How often have you had  to urinate less than every two hours? Not at All     How often have you found you stopped and started again several times when you urinated? Not at All     How often have you found it difficult to postpone urination? Less than 1 in 5 times     How often have you had a weak urinary stream? Not at All     How often have you had to strain to start urination? Not at All     How many times did you typically get up at night to urinate? 2 Times     Total IPSS Score 4     Quality of Life due to urinary symptoms   If you were to spend the rest of your life with your urinary condition just the way it is now how would you feel about that? Pleased        Score:  1-7 Mild 8-19 Moderate 20-35 Severe  Penile rash Patient has noted a rash on the head of his penis about the size of the thumb print.  He first noticed it one month and half ago.  He states that it has improved.  He states it is not itchy, scaly or had blisters on it. He also states that he does not have any pain.  It does not interfere with intercourse or urination.  Erectile dysfunction His SHIM score is 25, which is no ED.         SHIM      04/11/16 1145       SHIM: Over the last 6 months:   How do you rate your confidence that you could get and keep an erection? Very High     When you had erections with sexual stimulation, how often were your erections hard enough for penetration (entering your partner)? Almost Always or Always     During sexual intercourse, how often were you able to maintain your erection after you had penetrated (entered) your partner? Not Difficult     During sexual intercourse, how difficult was it to maintain your erection to completion of intercourse? Not Difficult     When you attempted sexual intercourse, how often was it satisfactory for you? Not Difficult     SHIM Total Score   SHIM 25        Score: 1-7 Severe ED 8-11 Moderate ED 12-16 Mild-Moderate ED 17-21 Mild ED 22-25 No  ED      PMH: Past Medical History  Diagnosis Date  . Diabetes mellitus without complication (HCC)   . Fatty liver   . Steatohepatitis, non-alcoholic   . Personal history of multiple concussions   . Dyslipidemia   . GERD (gastroesophageal reflux disease)   . Chronic headaches   . Diverticulosis   . Allergic rhinitis   . Hypogonadism in male   . BPH with obstruction/lower urinary tract symptoms   . Erectile dysfunction     Surgical History: Past Surgical History  Procedure Laterality Date  . Nasal sinus surgery    . Myelogram    . Liver biopsy  06/10/2009  . Triple bypass      Home Medications:    Medication List       This list is accurate as of: 04/11/16 11:59 PM.  Always use your most recent med list.               aspirin 81 MG tablet  Take 81 mg by mouth daily.     atorvastatin 10 MG tablet  Commonly known as:  LIPITOR  Take 10 mg by mouth daily.     cholecalciferol 400 units Tabs tablet  Commonly known as:  VITAMIN D  Take 1,000 Units by mouth daily.     doxycycline 100 MG capsule  Commonly known as:  VIBRAMYCIN     fenofibrate 160 MG tablet  Take 160 mg by mouth daily.     fexofenadine-pseudoephedrine 180-240 MG 24 hr tablet  Commonly known as:  ALLEGRA-D ALLERGY & CONGESTION  Take 1 tablet by mouth daily.     fluticasone 50 MCG/ACT nasal spray  Commonly known as:  FLONASE  Place 2 sprays into both nostrils daily.     hydrocortisone cream-nystatin cream-zinc oxide  Apply 1 application topically 2 (two) times daily.     lisinopril 5 MG tablet  Commonly known as:  PRINIVIL,ZESTRIL  Take 5 mg by mouth daily.     metFORMIN 850 MG tablet  Commonly known as:  GLUCOPHAGE  Take 850 mg by mouth 2 (two) times daily with a meal.  nystatin-triamcinolone ointment  Commonly known as:  MYCOLOG  Apply 1 application topically 2 (two) times daily.     omega-3 acid ethyl esters 1 g capsule  Commonly known as:  LOVAZA  Take 1 g by mouth 2 (two)  times daily.     testosterone 4 MG/24HR Pt24 patch  Commonly known as:  ANDRODERM  Place 1 patch onto the skin daily.     testosterone cypionate 200 MG/ML injection  Commonly known as:  DEPOTESTOSTERONE CYPIONATE  Inject 1 mL (200 mg total) into the muscle every 14 (fourteen) days.     vitamin E 400 UNIT capsule  Take 800 Units by mouth daily.        Allergies:  Allergies  Allergen Reactions  . Codeine   . Fenofibrate Micronized Other (See Comments)  . Fish Oil     Other reaction(s): Other (See Comments) Dyspepsia, foul taste, loose stools - tolerates Lovaza  . Niacin And Related   . Prednisone     Headache  . Tricor [Fenofibrate]     Family History: Family History  Problem Relation Age of Onset  . Hypertension Mother   . Transient ischemic attack Father   . Alcohol abuse Mother   . Diabetes Mother   . Hyperlipidemia Father   . Melanoma Father   . Heart attack Father   . Stroke Mother     Social History:  reports that he has never smoked. He has never used smokeless tobacco. He reports that he drinks alcohol. He reports that he does not use illicit drugs.  ROS: UROLOGY Frequent Urination?: No Hard to postpone urination?: No Burning/pain with urination?: No Get up at night to urinate?: Yes Leakage of urine?: No Urine stream starts and stops?: No Trouble starting stream?: No Do you have to strain to urinate?: No Blood in urine?: No Urinary tract infection?: No Sexually transmitted disease?: No Injury to kidneys or bladder?: No Painful intercourse?: No Weak stream?: No Erection problems?: No Penile pain?: No  Gastrointestinal Nausea?: No Vomiting?: No Indigestion/heartburn?: Yes Diarrhea?: No Constipation?: No  Constitutional Fever: No Night sweats?: No Weight loss?: No Fatigue?: No  Skin Skin rash/lesions?: No Itching?: No  Eyes Blurred vision?: No Double vision?: No  Ears/Nose/Throat Sore throat?: No Sinus problems?:  No  Hematologic/Lymphatic Swollen glands?: No Easy bruising?: No  Cardiovascular Leg swelling?: No Chest pain?: No  Respiratory Cough?: No Shortness of breath?: No  Endocrine Excessive thirst?: No  Musculoskeletal Back pain?: No Joint pain?: Yes  Neurological Headaches?: No Dizziness?: No  Psychologic Depression?: No Anxiety?: No  Physical Exam: BP 130/80 mmHg  Pulse 78  Ht 5\' 11"  (1.803 m)  Wt 200 lb (90.719 kg)  BMI 27.91 kg/m2  GU: Patient with circumcised phallus. Balanitis.  Urethral meatus is patent.  No penile discharge. No penile lesions or rashes. Scrotum without lesions, cysts, rashes and/or edema.  Testicles are located scrotally bilaterally. No masses are appreciated in the testicles. Left and right epididymis are normal. Rectal: Patient with  normal sphincter tone. Perineum without scarring or rashes. No rectal masses are appreciated. Prostate is approximately 45 grams, no nodules are appreciated. Seminal vesicles are normal.   Laboratory Data: Lab Results  Component Value Date   WBC 8.0 07/13/2013   HGB 16.1 02/14/2016   HCT 46.7 04/07/2016   MCV 83 07/13/2013   PLT 167 07/13/2013    Lab Results  Component Value Date   CREATININE 1.25 06/15/2012   PSA History:  0.2 ng/mL on 01/25/2013  0.2 ng/mL on 03/10/2014  0.3 ng/mL on 08/10/2014  0.3 ng/mL on 04/07/2016    Lab Results  Component Value Date   TESTOSTERONE 429 04/07/2016     Assessment & Plan:    1. BPH with LUTS:   Patient's IPSS score is 4/1.   He is experiencing some mild urgency.  He does not want to start any medication at this time.  He will follow up in 6 months for a PSA, exam and an IPSS score as testosterone therapy can affect the size of the prostate and worsen LUTS.    2. Hypogonadism:   Patient is currently managing his hypogonadism with 0.5 mL every week of testosterone cypionate /IM.  He will continue this dose.  He received an injection today.  He like to try  the patches at this time.   A prescription for Androderm 4 mg/24 hour patch is sent to his pharmacy.  We'll have a serum testosterone drawn one month after starting the patch therapy.        3. Balanitis:  Resolved.  4. Erectile dysfunction:   SHIM or is 25. We will continue to monitor as testosterone therapy can affect erections.    Return in about 1 month (around 05/11/2016) for Serum testosterone draw.  Michiel Cowboy, PA-C  [facility] [address] [place], [place] [address] [phone]

## 2016-04-11 NOTE — Progress Notes (Signed)
Testosterone IM Injection  Due to Hypogonadism patient is present today for a Testosterone Injection.  Medication: Testosterone Cypionate Dose: 0.615mL Location: right upper outer buttocks Lot: [id number] Exp:09/2017  Patient tolerated well, no complications were noted  Preformed by: Rupert Stackshelsea Torii Royse, LPN

## 2016-04-14 DIAGNOSIS — F5221 Male erectile disorder: Secondary | ICD-10-CM | POA: Insufficient documentation

## 2016-04-18 ENCOUNTER — Ambulatory Visit (INDEPENDENT_AMBULATORY_CARE_PROVIDER_SITE_OTHER): Payer: BC Managed Care – PPO

## 2016-04-18 DIAGNOSIS — E291 Testicular hypofunction: Secondary | ICD-10-CM | POA: Diagnosis not present

## 2016-04-18 MED ORDER — TESTOSTERONE CYPIONATE 200 MG/ML IM SOLN
200.0000 mg | Freq: Once | INTRAMUSCULAR | Status: AC
Start: 2016-04-18 — End: 2016-04-18
  Administered 2016-04-18: 200 mg via INTRAMUSCULAR

## 2016-04-18 NOTE — Progress Notes (Signed)
Testosterone IM Injection  Due to Hypogonadism patient is present today for a Testosterone Injection.  Medication: Testosterone Cypionate Dose: 0.145mL Location: left upper outer buttocks Lot: [id number] Exp:09/2017  Patient tolerated well, no complications were noted  Preformed by: Rupert Stackshelsea Watkins, LPN  Follow up: 1 week

## 2016-04-25 ENCOUNTER — Ambulatory Visit (INDEPENDENT_AMBULATORY_CARE_PROVIDER_SITE_OTHER): Payer: BC Managed Care – PPO

## 2016-04-25 ENCOUNTER — Ambulatory Visit: Payer: Self-pay

## 2016-04-25 DIAGNOSIS — E291 Testicular hypofunction: Secondary | ICD-10-CM | POA: Diagnosis not present

## 2016-04-25 MED ORDER — TESTOSTERONE CYPIONATE 200 MG/ML IM SOLN
200.0000 mg | Freq: Once | INTRAMUSCULAR | Status: AC
Start: 2016-04-25 — End: 2016-04-25
  Administered 2016-04-25: 200 mg via INTRAMUSCULAR

## 2016-04-25 NOTE — Progress Notes (Signed)
Testosterone IM Injection  Due to Hypogonadism patient is present today for a Testosterone Injection.  Medication: Testosterone Cypionate Dose: 0.5mL Location: right upper outer buttocks Lot: [id number] Exp:09/2017  Patient tolerated well, no complications were noted  Preformed by: Chelsea Watkins, LPN    

## 2016-05-05 ENCOUNTER — Ambulatory Visit (INDEPENDENT_AMBULATORY_CARE_PROVIDER_SITE_OTHER): Payer: BC Managed Care – PPO

## 2016-05-05 DIAGNOSIS — E291 Testicular hypofunction: Secondary | ICD-10-CM | POA: Diagnosis not present

## 2016-05-05 MED ORDER — TESTOSTERONE CYPIONATE 200 MG/ML IM SOLN
200.0000 mg | Freq: Once | INTRAMUSCULAR | Status: AC
  Administered 2016-05-05: 200 mg via INTRAMUSCULAR

## 2016-05-05 NOTE — Progress Notes (Signed)
Testosterone IM Injection  Due to Hypogonadism patient is present today for a Testosterone Injection.  Medication: Testosterone Cypionate Dose: 0.755mL Location: right upper outer buttocks Lot: [id number] Exp:09/2017  Patient tolerated well, no complications were noted  Preformed by: Rupert Stackshelsea Chauntay Paszkiewicz, LPN

## 2016-05-07 ENCOUNTER — Telehealth: Payer: Self-pay

## 2016-05-07 NOTE — Telephone Encounter (Signed)
PA for anderoderm patch APPROVED. PA #-[employer] plan [id number] grandfathered [phone] [place]

## 2016-05-09 ENCOUNTER — Ambulatory Visit (INDEPENDENT_AMBULATORY_CARE_PROVIDER_SITE_OTHER): Payer: BC Managed Care – PPO | Admitting: *Deleted

## 2016-05-09 DIAGNOSIS — E291 Testicular hypofunction: Secondary | ICD-10-CM

## 2016-05-09 MED ORDER — TESTOSTERONE CYPIONATE 200 MG/ML IM SOLN
200.0000 mg | Freq: Once | INTRAMUSCULAR | Status: AC
  Administered 2016-05-09: 200 mg via INTRAMUSCULAR

## 2016-05-09 NOTE — Progress Notes (Signed)
Testosterone IM Injection  Due to Hypogonadism patient is present today for a Testosterone Injection.  Medication: Testosterone Cypionate Dose: 0.605ml Location: left upper outer buttocks Lot: [id number] Exp: 09/2017  Patient tolerated well, no complications were noted  Preformed by: Dallas Schimkeamona Teshawn Moan CMA  Follow up: Patient to start patches next week

## 2016-05-12 ENCOUNTER — Telehealth: Payer: Self-pay

## 2016-05-12 DIAGNOSIS — E291 Testicular hypofunction: Secondary | ICD-10-CM

## 2016-05-12 NOTE — Telephone Encounter (Signed)
LMOM-pt needs labs in 79mo after starting androderm.

## 2016-05-14 NOTE — Telephone Encounter (Signed)
Spoke with pt in reference to needing labs in 56mo. Lab appt made. Orders placed.

## 2016-06-13 ENCOUNTER — Other Ambulatory Visit: Payer: BC Managed Care – PPO

## 2016-06-13 DIAGNOSIS — E291 Testicular hypofunction: Secondary | ICD-10-CM

## 2016-06-16 LAB — TESTOSTERONE: TESTOSTERONE: 108 ng/dL — AB (ref 348–1197)

## 2016-06-16 LAB — HEMATOCRIT: Hematocrit: 47.1 % (ref 37.5–51.0)

## 2016-06-17 ENCOUNTER — Other Ambulatory Visit: Payer: Self-pay | Admitting: Urology

## 2016-06-17 ENCOUNTER — Telehealth: Payer: Self-pay

## 2016-06-17 DIAGNOSIS — E291 Testicular hypofunction: Secondary | ICD-10-CM

## 2016-06-17 MED ORDER — TESTOSTERONE 4 MG/24HR TD PT24: MEDICATED_PATCH | TRANSDERMAL | Status: DC

## 2016-06-17 NOTE — Telephone Encounter (Signed)
Spoke with pt in reference to doubling dose. Pt stated he would like to give that a try and if it doesn't work then he would prefer to go back to the injections.

## 2016-06-17 NOTE — Telephone Encounter (Signed)
Spoke with pt in reference to lab results. Pt stated that he is still using the patches.

## 2016-06-17 NOTE — Telephone Encounter (Signed)
-----   Message from Harle BattiestShannon A McGowan, PA-C sent at 06/16/2016  2:55 PM EDT ----- His hematocrit level is fine. His testosterone is low.  Is he still receiving injections or  on the patch?

## 2016-06-17 NOTE — Telephone Encounter (Signed)
He is on the highest dose of the patch.  Would he like to try two patches?

## 2016-06-17 NOTE — Telephone Encounter (Signed)
Pt stated that he would prefer not to use the natesto due to sinus issues.

## 2016-06-17 NOTE — Telephone Encounter (Signed)
Is he applied a new patch every day?  Is he applying the new patch on a different location on his body every day?

## 2016-06-17 NOTE — Telephone Encounter (Signed)
The patches are not getting the patient to therapeutic levels. When he like to try the Natesto at this time?

## 2016-06-17 NOTE — Telephone Encounter (Signed)
Pt stated that he is changing the locations daily and has 16 different locations he uses. Pt also stated that he has followed the instructions given by the pharmacy except the instructions state pt should apply patches at night and leave on for 24hrs. Pt stated that he applies patches in the morning after a shower and leaves on for 24hrs.

## 2016-06-25 ENCOUNTER — Telehealth: Payer: Self-pay | Admitting: Urology

## 2016-06-25 NOTE — Telephone Encounter (Signed)
Pt called and wanted to come in on July 19th to have Testosterone, Hematocrit and Hemoglobin checked.  I made appt, but wanted to check with you about orders.

## 2016-07-09 ENCOUNTER — Other Ambulatory Visit: Payer: BC Managed Care – PPO

## 2016-07-16 ENCOUNTER — Other Ambulatory Visit: Payer: BC Managed Care – PPO

## 2016-07-16 DIAGNOSIS — E291 Testicular hypofunction: Secondary | ICD-10-CM

## 2016-07-17 ENCOUNTER — Telehealth: Payer: Self-pay

## 2016-07-17 DIAGNOSIS — E291 Testicular hypofunction: Secondary | ICD-10-CM

## 2016-07-17 LAB — TESTOSTERONE: Testosterone: 33 ng/dL — ABNORMAL LOW (ref 264–916)

## 2016-07-17 NOTE — Telephone Encounter (Signed)
-----   Message from Harle Battiest, PA-C sent at 07/17/2016  8:33 AM EDT ----- Please notify patient that his testosterone is very low.  The Androderm is not getting his levels to the therapeutic range.  We can try Natesto.

## 2016-07-17 NOTE — Telephone Encounter (Signed)
Spoke with pt in reference to testosterone results. Pt does not want to do Natesto due to sinus problems. Pt stated that hes ok with going back to the shots. Please advise.

## 2016-07-18 NOTE — Telephone Encounter (Signed)
That is fine to go back to injections of testosterone cypionate.

## 2016-07-22 MED ORDER — TESTOSTERONE CYPIONATE 200 MG/ML IM SOLN: 200.0000 mg | INTRAMUSCULAR | 0 refills | Status: DC

## 2016-07-22 NOTE — Telephone Encounter (Signed)
Spoke with patient and gave him the message that he can go back to taking injections. Rx called in. Patient ok with plan.

## 2016-07-25 ENCOUNTER — Ambulatory Visit: Payer: BC Managed Care – PPO

## 2016-08-01 ENCOUNTER — Ambulatory Visit (INDEPENDENT_AMBULATORY_CARE_PROVIDER_SITE_OTHER): Payer: BC Managed Care – PPO | Admitting: *Deleted

## 2016-08-01 DIAGNOSIS — E291 Testicular hypofunction: Secondary | ICD-10-CM | POA: Diagnosis not present

## 2016-08-01 MED ORDER — TESTOSTERONE CYPIONATE 200 MG/ML IM SOLN
200.0000 mg | Freq: Once | INTRAMUSCULAR | Status: AC
  Administered 2016-08-01: 200 mg via INTRAMUSCULAR

## 2016-08-01 NOTE — Progress Notes (Signed)
Testosterone IM Injection  Due to Hypogonadism patient is present today for a Testosterone Injection.  Medication: Testosterone Cypionate Dose: 0.185ml (100mg ) Location: right upper outer buttocks Lot: [id number] Exp:02/19  Patient tolerated well, no complications were noted  Preformed by: Dallas Schimkeamona Markeia Harkless CMA  Follow up: One week  Patient states this is his second week of injections. Stated his wife gave him 0.495ml last week because he had no energy and he needed to get up hay. Dennie Bibleat ient's wife used to give him the injections for years before they stopped that. Patient was truthful and wanted it documented in chart.

## 2016-08-03 ENCOUNTER — Other Ambulatory Visit: Payer: Self-pay | Admitting: Urology

## 2016-08-08 ENCOUNTER — Ambulatory Visit (INDEPENDENT_AMBULATORY_CARE_PROVIDER_SITE_OTHER): Payer: BC Managed Care – PPO

## 2016-08-08 DIAGNOSIS — E291 Testicular hypofunction: Secondary | ICD-10-CM | POA: Diagnosis not present

## 2016-08-08 MED ORDER — TESTOSTERONE CYPIONATE 200 MG/ML IM SOLN
200.0000 mg | Freq: Once | INTRAMUSCULAR | Status: AC
  Administered 2016-08-08: 200 mg via INTRAMUSCULAR

## 2016-08-08 NOTE — Progress Notes (Signed)
Testosterone IM Injection  Due to Hypogonadism patient is present today for a Testosterone Injection.  Medication: Testosterone Cypionate Dose: 0.735mL Location: right upper outer buttocks Lot: [id number] Exp:02/2018  Patient tolerated well, no complications were noted  Preformed by: Leeroy Bockhelsea Preslie Depasquale, LPN   Follow up: 1 week

## 2016-08-18 ENCOUNTER — Ambulatory Visit (INDEPENDENT_AMBULATORY_CARE_PROVIDER_SITE_OTHER): Payer: BC Managed Care – PPO | Admitting: Family Medicine

## 2016-08-18 DIAGNOSIS — E291 Testicular hypofunction: Secondary | ICD-10-CM

## 2016-08-18 MED ORDER — TESTOSTERONE CYPIONATE 200 MG/ML IM SOLN
100.0000 mg | Freq: Once | INTRAMUSCULAR | Status: AC
Start: 2016-08-18 — End: 2016-08-18
  Administered 2016-08-18: 100 mg via INTRAMUSCULAR

## 2016-08-18 NOTE — Progress Notes (Signed)
Testosterone IM Injection  Due to Hypogonadism patient is present today for a Testosterone Injection.  Medication: Testosterone Cypionate Dose: 0.5ML Location: left upper outer buttocks Lot: [id number]  Exp:02/2018  Patient tolerated well, no complications were noted  Preformed by: Teressa Lowerarrie Garrison, CMA  Follow up: 1 Week for 0.5ML injection

## 2016-08-22 ENCOUNTER — Ambulatory Visit (INDEPENDENT_AMBULATORY_CARE_PROVIDER_SITE_OTHER): Payer: BC Managed Care – PPO

## 2016-08-22 DIAGNOSIS — E291 Testicular hypofunction: Secondary | ICD-10-CM

## 2016-08-22 MED ORDER — TESTOSTERONE CYPIONATE 200 MG/ML IM SOLN
200.0000 mg | Freq: Once | INTRAMUSCULAR | Status: AC
  Administered 2016-08-22: 200 mg via INTRAMUSCULAR

## 2016-08-22 NOTE — Progress Notes (Signed)
Testosterone IM Injection  Due to Hypogonadism patient is present today for a Testosterone Injection.  Medication: Testosterone Cypionate Dose: 0.385mL Location: left upper outer buttocks Lot: [id number] Exp:01/2018  Patient tolerated well, no complications were noted  Preformed by: Rupert Stackshelsea Watkins, LPN   Follow up: pt had testosterone and hct drawn today due to having been on injections for 62mo.

## 2016-08-23 LAB — HEMATOCRIT: Hematocrit: 46.1 % (ref 37.5–51.0)

## 2016-08-23 LAB — TESTOSTERONE: TESTOSTERONE: 489 ng/dL (ref 264–916)

## 2016-08-26 ENCOUNTER — Telehealth: Payer: Self-pay

## 2016-08-26 NOTE — Telephone Encounter (Signed)
Spoke with pt in reference lab results. Pt voiced understanding.  

## 2016-08-26 NOTE — Telephone Encounter (Signed)
-----   Message from Harle BattiestShannon A McGowan, PA-C sent at 08/26/2016  1:01 PM EDT ----- Please notify the patient that his labs are good.  I will see him in November.

## 2016-08-29 ENCOUNTER — Ambulatory Visit (INDEPENDENT_AMBULATORY_CARE_PROVIDER_SITE_OTHER): Payer: BC Managed Care – PPO

## 2016-08-29 DIAGNOSIS — E291 Testicular hypofunction: Secondary | ICD-10-CM

## 2016-08-29 MED ORDER — TESTOSTERONE CYPIONATE 200 MG/ML IM SOLN
200.0000 mg | Freq: Once | INTRAMUSCULAR | Status: AC
  Administered 2016-08-29: 200 mg via INTRAMUSCULAR

## 2016-08-29 NOTE — Progress Notes (Signed)
Testosterone IM Injection  Due to Hypogonadism patient is present today for a Testosterone Injection.  Medication: Testosterone Cypionate Dose: 0.375mL Location: right upper outer buttocks Lot: [id number] Exp:02/2018  Patient tolerated well, no complications were noted  Preformed by: Rupert Stackshelsea Joneisha Miles, LPN   Follow up: 1 week

## 2016-09-05 ENCOUNTER — Ambulatory Visit (INDEPENDENT_AMBULATORY_CARE_PROVIDER_SITE_OTHER): Payer: BC Managed Care – PPO | Admitting: Family Medicine

## 2016-09-05 DIAGNOSIS — E291 Testicular hypofunction: Secondary | ICD-10-CM

## 2016-09-05 MED ORDER — TESTOSTERONE CYPIONATE 200 MG/ML IM SOLN
200.0000 mg | Freq: Once | INTRAMUSCULAR | Status: AC
  Administered 2016-09-05: 200 mg via INTRAMUSCULAR

## 2016-09-05 NOTE — Progress Notes (Signed)
Testosterone IM Injection  Due to Hypogonadism patient is present today for a Testosterone Injection.  Medication: Testosterone Cypionate Dose: 0.5ML Location: left upper outer buttocks Lot: [id number] Exp:01-2018  Patient tolerated well, no complications were noted  Preformed by: Teressa Lowerarrie Garrison, CMA  Follow up: 1 week

## 2016-09-12 ENCOUNTER — Ambulatory Visit: Payer: BC Managed Care – PPO | Admitting: Family Medicine

## 2016-09-12 DIAGNOSIS — E291 Testicular hypofunction: Secondary | ICD-10-CM

## 2016-09-12 NOTE — Progress Notes (Signed)
Testosterone IM Injection  Due to Hypogonadism patient is present today for a Testosterone Injection.  Medication: Testosterone Cypionate Dose: 0.5ML Location: right upper outer buttocks Lot: [id number]:02/2018  Patient tolerated well, no complications were noted  Preformed by: Teressa Lowerarrie Garrison, CMA  Follow up: 1 week

## 2016-09-19 ENCOUNTER — Ambulatory Visit (INDEPENDENT_AMBULATORY_CARE_PROVIDER_SITE_OTHER): Payer: BC Managed Care – PPO

## 2016-09-19 DIAGNOSIS — E291 Testicular hypofunction: Secondary | ICD-10-CM

## 2016-09-19 MED ORDER — TESTOSTERONE CYPIONATE 200 MG/ML IM SOLN
200.0000 mg | Freq: Once | INTRAMUSCULAR | Status: AC
  Administered 2016-09-19: 200 mg via INTRAMUSCULAR

## 2016-09-19 NOTE — Progress Notes (Signed)
Testosterone IM Injection  Due to Hypogonadism patient is present today for a Testosterone Injection.  Medication: Testosterone Cypionate Dose: 0.565mL Location: right upper outer buttocks Lot: [id number] Exp:02/2018  Patient tolerated well, no complications were noted  Preformed by: Rupert Stackshelsea Izac Faulkenberry, LPN

## 2016-09-26 ENCOUNTER — Ambulatory Visit (INDEPENDENT_AMBULATORY_CARE_PROVIDER_SITE_OTHER): Payer: BC Managed Care – PPO | Admitting: Urology

## 2016-09-26 DIAGNOSIS — E291 Testicular hypofunction: Secondary | ICD-10-CM

## 2016-09-26 MED ORDER — TESTOSTERONE CYPIONATE 200 MG/ML IM SOLN
100.0000 mg | Freq: Once | INTRAMUSCULAR | Status: AC
  Administered 2016-09-26: 100 mg via INTRAMUSCULAR

## 2016-09-26 NOTE — Progress Notes (Signed)
Testosterone IM Injection  Due to Hypogonadism patient is present today for a Testosterone Injection.  Medication: Testosterone Cypionate Dose: 0.605ml Location: left upper outer buttocks Lot: [id number] Exp:02/2018  Patient tolerated well, no complications were noted  Preformed by: Eligha BridegroomSarah Unity Luepke, CMA  Follow up: 1 week- patient will be out of town- wife giving injection, patient will come in for next injection in two weeks for 0.585ml apt was made.

## 2016-10-10 ENCOUNTER — Ambulatory Visit (INDEPENDENT_AMBULATORY_CARE_PROVIDER_SITE_OTHER): Payer: BC Managed Care – PPO | Admitting: Urology

## 2016-10-10 ENCOUNTER — Encounter: Payer: Self-pay | Admitting: Urology

## 2016-10-10 DIAGNOSIS — E291 Testicular hypofunction: Secondary | ICD-10-CM

## 2016-10-10 MED ORDER — TESTOSTERONE CYPIONATE 200 MG/ML IM SOLN
200.0000 mg | Freq: Once | INTRAMUSCULAR | Status: AC
  Administered 2016-10-10: 200 mg via INTRAMUSCULAR

## 2016-10-10 NOTE — Progress Notes (Signed)
Testosterone IM Injection  Due to Hypogonadism patient is present today for a Testosterone Injection.  Medication: Testosterone Cypionate Dose: .05ml  100mg  Location: right upper outer buttocks Lot: [id number] Exp:02/2018  Patient tolerated well, no complications were noted  Preformed by: Dallas Schimkeamona [employer] CMA  Follow up: One week

## 2016-10-13 ENCOUNTER — Ambulatory Visit: Payer: BC Managed Care – PPO | Admitting: Urology

## 2016-10-13 ENCOUNTER — Other Ambulatory Visit: Payer: Self-pay | Admitting: Family Medicine

## 2016-10-13 ENCOUNTER — Encounter: Payer: Self-pay | Admitting: Urology

## 2016-10-13 VITALS — BP 113/76 | HR 78 | Ht 71.5 in | Wt 200.0 lb

## 2016-10-13 DIAGNOSIS — E291 Testicular hypofunction: Secondary | ICD-10-CM | POA: Diagnosis not present

## 2016-10-13 DIAGNOSIS — N401 Enlarged prostate with lower urinary tract symptoms: Secondary | ICD-10-CM

## 2016-10-13 DIAGNOSIS — N138 Other obstructive and reflux uropathy: Secondary | ICD-10-CM

## 2016-10-13 DIAGNOSIS — N529 Male erectile dysfunction, unspecified: Secondary | ICD-10-CM

## 2016-10-13 NOTE — Progress Notes (Signed)
9:23 AM   Jeffrey Cook. 07-05-1966 [id number]  Referring provider: Orene Desanctis, MD [address] [place], [place] [address]  Chief Complaint  Patient presents with  . Hypogonadism    6 month follow up     HPI: Patient is a 50 year old Caucasian male with hypogonadism and BPH with LUTS who presents today for 6 month follow-up.  Hypogonadism Patient is experiencing a lack of energy, a decrease in strength and falling asleep after dinner.  This is indicated by his responses to the ADAM questionnaire.   He is still having spontaneous erections at night.  He does not have sleep apnea.  His current morning testosterone level was 489 ng/dL on 19/14/7829.  He is currently managing his hypogonadism with 100 mg IM every week.       Androgen Deficiency in the Aging Male    Row Name 10/13/16 0900         Androgen Deficiency in the Aging Male   Do you have a decrease in libido (sex drive) No     Do you have lack of energy Yes     Do you have a decrease in strength and/or endurance Yes     Have you lost height No     Have you noticed a decreased "enjoyment of life" No     Are you sad and/or grumpy No     Are your erections less strong No     Have you noticed a recent deterioration in your ability to play sports No     Are you falling asleep after dinner Yes     Has there been a recent deterioration in your work performance No        BPH WITH LUTS His IPSS score today is 4, which is mild lower urinary tract symptomatology. He is pleased with his quality life due to his urinary symptoms.  His previous IPSS score was 4/1.  His major complaint today  urgency.  He has had these symptoms for several months.  He denies any dysuria, hematuria or suprapubic pain.   He also denies any recent fevers, chills, nausea or vomiting.  He does not have a family history of PCa.      IPSS    Row Name 10/13/16 0900         International Prostate Symptom Score   How often have you had the  sensation of not emptying your bladder? Less than 1 in 5     How often have you had to urinate less than every two hours? Less than 1 in 5 times     How often have you found you stopped and started again several times when you urinated? Not at All     How often have you found it difficult to postpone urination? Less than 1 in 5 times     How often have you had a weak urinary stream? Not at All     How often have you had to strain to start urination? Not at All     How many times did you typically get up at night to urinate? 1 Time     Total IPSS Score 4       Quality of Life due to urinary symptoms   If you were to spend the rest of your life with your urinary condition just the way it is now how would you feel about that? Pleased        Score:  1-7 Mild  8-19 Moderate 20-35 Severe  Penile rash Patient has noted a rash on the head of his penis about the size of the thumb print. He first noticed it one month and half ago.  He states that it has improved.  He states it is not itchy, scaly or had blisters on it. He also states that he does not have any pain.  It does not interfere with intercourse or urination.  Erectile dysfunction His SHIM score is 24, which is no ED.   His previous SHIM was 25.       SHIM    Row Name 10/13/16 0907         SHIM: Over the last 6 months:   How do you rate your confidence that you could get and keep an erection? High     When you had erections with sexual stimulation, how often were your erections hard enough for penetration (entering your partner)? Almost Always or Always     During sexual intercourse, how often were you able to maintain your erection after you had penetrated (entered) your partner? Not Difficult     During sexual intercourse, how difficult was it to maintain your erection to completion of intercourse? Not Difficult     When you attempted sexual intercourse, how often was it satisfactory for you? Not Difficult       SHIM Total Score    SHIM 24        Score: 1-7 Severe ED 8-11 Moderate ED 12-16 Mild-Moderate ED 17-21 Mild ED 22-25 No ED      PMH: Past Medical History:  Diagnosis Date  . Allergic rhinitis   . BPH with obstruction/lower urinary tract symptoms   . Chronic headaches   . Diabetes mellitus without complication (HCC)   . Diverticulosis   . Dyslipidemia   . Erectile dysfunction   . Fatty liver   . GERD (gastroesophageal reflux disease)   . Hypogonadism in male   . Personal history of multiple concussions   . Steatohepatitis, non-alcoholic     Surgical History: Past Surgical History:  Procedure Laterality Date  . LIVER BIOPSY  06/10/2009  . MYELOGRAM    . NASAL SINUS SURGERY      Home Medications:    Medication List       Accurate as of 10/13/16  9:23 AM. Always use your most recent med list.          aspirin 81 MG tablet Take 81 mg by mouth daily.   atorvastatin 10 MG tablet Commonly known as:  LIPITOR Take 10 mg by mouth daily.   cholecalciferol 400 units Tabs tablet Commonly known as:  VITAMIN D Take 1,000 Units by mouth daily.   doxycycline 100 MG capsule Commonly known as:  VIBRAMYCIN   fenofibrate 160 MG tablet Take 160 mg by mouth daily.   fexofenadine-pseudoephedrine 180-240 MG 24 hr tablet Commonly known as:  ALLEGRA-D ALLERGY & CONGESTION Take 1 tablet by mouth daily.   fluticasone 50 MCG/ACT nasal spray Commonly known as:  FLONASE Place 2 sprays into both nostrils daily.   hydrocortisone cream-nystatin cream-zinc oxide Apply 1 application topically 2 (two) times daily.   lisinopril 5 MG tablet Commonly known as:  PRINIVIL,ZESTRIL Take 5 mg by mouth daily.   metFORMIN 850 MG tablet Commonly known as:  GLUCOPHAGE Take 850 mg by mouth 2 (two) times daily with a meal.   nystatin-triamcinolone ointment Commonly known as:  MYCOLOG Apply 1 application topically 2 (two) times daily.  omega-3 acid ethyl esters 1 g capsule Commonly known as:   LOVAZA Take 1 g by mouth 2 (two) times daily.   testosterone 4 MG/24HR Pt24 patch Commonly known as:  ANDRODERM Apply 2 patches daily   testosterone cypionate 200 MG/ML injection Commonly known as:  DEPOTESTOSTERONE CYPIONATE Inject 1 mL (200 mg total) into the muscle every 14 (fourteen) days.   testosterone cypionate 200 MG/ML injection Commonly known as:  DEPOTESTOSTERONE CYPIONATE Inject 1 mL (200 mg total) into the muscle every 14 (fourteen) days.   testosterone cypionate 200 MG/ML injection Commonly known as:  DEPOTESTOSTERONE CYPIONATE INJECT 1 ML( 200 MG) INTO THE MUSCLE EVERY 14 DAYS   vitamin E 400 UNIT capsule Take 800 Units by mouth daily.       Allergies:  Allergies  Allergen Reactions  . Codeine   . Fenofibrate Other (See Comments)  . Fish Oil     Other reaction(s): Other (See Comments) Dyspepsia, foul taste, loose stools - tolerates Lovaza  . Niacin And Related   . Prednisone     Headache  . Tricor [Fenofibrate]     Family History: Family History  Problem Relation Age of Onset  . Hypertension Mother   . Alcohol abuse Mother   . Diabetes Mother   . Stroke Mother   . Transient ischemic attack Father   . Hyperlipidemia Father   . Melanoma Father   . Heart attack Father     Social History:  reports that he has never smoked. He has never used smokeless tobacco. He reports that he drinks alcohol. He reports that he does not use drugs.  ROS: UROLOGY Frequent Urination?: No Hard to postpone urination?: No Burning/pain with urination?: No Get up at night to urinate?: No Leakage of urine?: No Urine stream starts and stops?: No Trouble starting stream?: No Do you have to strain to urinate?: No Blood in urine?: No Urinary tract infection?: No Sexually transmitted disease?: No Injury to kidneys or bladder?: No Painful intercourse?: No Weak stream?: No Erection problems?: No Penile pain?: No  Gastrointestinal Nausea?: No Vomiting?:  No Indigestion/heartburn?: No Diarrhea?: No Constipation?: No  Constitutional Fever: No Night sweats?: No Weight loss?: No Fatigue?: No  Skin Skin rash/lesions?: No Itching?: No  Eyes Blurred vision?: No Double vision?: No  Ears/Nose/Throat Sore throat?: No Sinus problems?: Yes  Hematologic/Lymphatic Swollen glands?: Yes Easy bruising?: No  Cardiovascular Leg swelling?: No Chest pain?: No  Respiratory Cough?: Yes Shortness of breath?: No  Endocrine Excessive thirst?: No  Musculoskeletal Back pain?: No Joint pain?: No  Neurological Headaches?: Yes Dizziness?: No  Psychologic Depression?: No Anxiety?: No  Physical Exam: BP 113/76   Pulse 78   Ht 5' 11.5" (1.816 m)   Wt 200 lb (90.7 kg)   BMI 27.51 kg/m   GU: Patient with circumcised phallus. Balanitis.  Urethral meatus is patent.  No penile discharge. No penile lesions or rashes. Scrotum without lesions, cysts, rashes and/or edema.  Testicles are located scrotally bilaterally. No masses are appreciated in the testicles. Left and right epididymis are normal. Rectal: Patient with  normal sphincter tone. Perineum without scarring or rashes. No rectal masses are appreciated. Prostate is approximately 45 grams, no nodules are appreciated. Seminal vesicles are normal.   Laboratory Data: Lab Results  Component Value Date   WBC 8.0 07/13/2013   HGB 16.1 02/14/2016   HCT 46.1 08/22/2016   MCV 83 07/13/2013   PLT 167 07/13/2013    Lab Results  Component Value Date  CREATININE 1.25 06/15/2012   PSA History:  0.2 ng/mL on 01/25/2013  0.2 ng/mL on 03/10/2014  0.3 ng/mL on 08/10/2014  0.3 ng/mL on 04/07/2016    Lab Results  Component Value Date   TESTOSTERONE 489 08/22/2016     Assessment & Plan:    1. Hypogonadism:     -most recent testosterone level is 489 ng/dL on 40/98/1191  -continue testosterone cypionate injections, 100 mg IM every week  -RTC in 3 months for HCT and  testosterone  -RTC in 6 months for HCT, testosterone, PSA, LFT's, ADAM and exam  2. BPH with LUTS  - IPSS score is 4/1, it is stable  - Continue conservative management, avoiding bladder irritants and timed voiding's  - RTC in 6 months for IPSS, PSA and exam, as testosterone therapy can cause prostate enlargement and worsen LUTS  3. Erectile dysfunction:     -SHIM score is 24  -RTC in 6 months for SHIM score and exam, as testosterone therapy can affect erections   Return in about 3 months (around 01/13/2017) for HCT and serum testosterone level one week after injection.  Michiel Cowboy, PA-C  [facility] [address], [address] [place], [place] [address] [phone]

## 2016-10-14 ENCOUNTER — Telehealth: Payer: Self-pay

## 2016-10-14 LAB — PSA: PROSTATE SPECIFIC AG, SERUM: 0.3 ng/mL (ref 0.0–4.0)

## 2016-10-14 NOTE — Telephone Encounter (Signed)
Spoke with pt in reference to PSA results. Pt voiced understanding.  

## 2016-10-14 NOTE — Telephone Encounter (Signed)
-----   Message from Harle BattiestShannon A McGowan, PA-C sent at 10/14/2016  8:23 AM EDT ----- PSA is stable.

## 2016-10-17 ENCOUNTER — Ambulatory Visit (INDEPENDENT_AMBULATORY_CARE_PROVIDER_SITE_OTHER): Payer: BC Managed Care – PPO | Admitting: Urology

## 2016-10-17 DIAGNOSIS — E291 Testicular hypofunction: Secondary | ICD-10-CM | POA: Diagnosis not present

## 2016-10-17 MED ORDER — TESTOSTERONE CYPIONATE 200 MG/ML IM SOLN
200.0000 mg | Freq: Once | INTRAMUSCULAR | Status: AC
  Administered 2016-10-17: 200 mg via INTRAMUSCULAR

## 2016-10-17 NOTE — Progress Notes (Signed)
Testosterone IM Injection  Due to Hypogonadism patient is present today for a Testosterone Injection.  Medication: Testosterone Cypionate Dose: 0.4705ml 100mg  Location: left upper outer buttocks Lot: [id number] Exp:02/2018  Patient tolerated well, no complications were noted  Preformed by: Dallas Schimkeamona Kayleen Alig CMA  Follow up: 2 weeks

## 2016-10-24 ENCOUNTER — Ambulatory Visit (INDEPENDENT_AMBULATORY_CARE_PROVIDER_SITE_OTHER): Payer: BC Managed Care – PPO | Admitting: Urology

## 2016-10-24 DIAGNOSIS — E291 Testicular hypofunction: Secondary | ICD-10-CM | POA: Diagnosis not present

## 2016-10-24 MED ORDER — TESTOSTERONE CYPIONATE 200 MG/ML IM SOLN
100.0000 mg | Freq: Once | INTRAMUSCULAR | Status: AC
  Administered 2016-10-24: 100 mg via INTRAMUSCULAR

## 2016-10-24 NOTE — Progress Notes (Signed)
Testosterone IM Injection  Due to Hypogonadism patient is present today for a Testosterone Injection.  Medication: Testosterone Cypionate Dose: 0.95ml/ 100mg  Location: left upper outer buttocks Lot: [id number] Exp:02/2018  Patient tolerated well, no complications were noted  Preformed by: Eligha BridegroomSarah Watts, CMA  Follow up: 1 week

## 2016-10-31 ENCOUNTER — Ambulatory Visit (INDEPENDENT_AMBULATORY_CARE_PROVIDER_SITE_OTHER): Payer: BC Managed Care – PPO | Admitting: Urology

## 2016-10-31 DIAGNOSIS — E291 Testicular hypofunction: Secondary | ICD-10-CM | POA: Diagnosis not present

## 2016-10-31 MED ORDER — TESTOSTERONE CYPIONATE 200 MG/ML IM SOLN
200.0000 mg | Freq: Once | INTRAMUSCULAR | Status: AC
  Administered 2016-10-31: 200 mg via INTRAMUSCULAR

## 2016-10-31 NOTE — Progress Notes (Signed)
Testosterone IM Injection  Due to Hypogonadism patient is present today for a Testosterone Injection.  Medication: Testosterone Cypionate Dose: 0.205ml Location: right upper outer buttocks Lot: [id number] Exp:02/2018  Patient tolerated well, no complications were noted  Preformed by: Teressa Lowerarrie Gracious Renken, CMA

## 2016-11-07 ENCOUNTER — Ambulatory Visit (INDEPENDENT_AMBULATORY_CARE_PROVIDER_SITE_OTHER): Payer: BC Managed Care – PPO | Admitting: Urology

## 2016-11-07 DIAGNOSIS — E291 Testicular hypofunction: Secondary | ICD-10-CM | POA: Diagnosis not present

## 2016-11-07 MED ORDER — TESTOSTERONE CYPIONATE 200 MG/ML IM SOLN
100.0000 mg | Freq: Once | INTRAMUSCULAR | Status: AC
  Administered 2016-11-07: 100 mg via INTRAMUSCULAR

## 2016-11-07 NOTE — Progress Notes (Signed)
Testosterone IM Injection  Due to Hypogonadism patient is present today for a Testosterone Injection.  Medication: Testosterone Cypionate Dose: 0.5mg /13500ml Location: upper outer buttocks Lot: [id number] Exp:02-2018  Patient tolerated well, no complications were noted  Preformed by: Eligha BridegroomSarah Llewellyn Schoenberger, CMA  Follow up: 1 week, patient was told he has one injection left and to get a refill on medication

## 2016-11-21 ENCOUNTER — Ambulatory Visit (INDEPENDENT_AMBULATORY_CARE_PROVIDER_SITE_OTHER): Payer: BC Managed Care – PPO | Admitting: Urology

## 2016-11-21 DIAGNOSIS — E291 Testicular hypofunction: Secondary | ICD-10-CM

## 2016-11-21 MED ORDER — TESTOSTERONE CYPIONATE 200 MG/ML IM SOLN
200.0000 mg | Freq: Once | INTRAMUSCULAR | Status: AC
  Administered 2016-11-21: 200 mg via INTRAMUSCULAR

## 2016-11-21 NOTE — Progress Notes (Signed)
Testosterone IM Injection  Due to Hypogonadism patient is present today for a Testosterone Injection.  Medication: Testosterone Cypionate Dose: 0.345ml Location: left upper outer buttocks Lot: [id number] Exp:02/2018  Patient tolerated well, no complications were noted  Preformed by: Teressa Lowerarrie Sylvanna Burggraf, CMA  Follow up: 1 week

## 2016-11-28 ENCOUNTER — Ambulatory Visit (INDEPENDENT_AMBULATORY_CARE_PROVIDER_SITE_OTHER): Payer: BC Managed Care – PPO | Admitting: Urology

## 2016-11-28 DIAGNOSIS — E291 Testicular hypofunction: Secondary | ICD-10-CM | POA: Diagnosis not present

## 2016-11-28 MED ORDER — TESTOSTERONE CYPIONATE 200 MG/ML IM SOLN
100.0000 mg | Freq: Once | INTRAMUSCULAR | Status: AC
  Administered 2016-11-28: 100 mg via INTRAMUSCULAR

## 2016-11-28 NOTE — Progress Notes (Signed)
Testosterone IM Injection  Due to Hypogonadism patient is present today for a Testosterone Injection.  Medication: Testosterone Cypionate Dose: 0.785ml Location: right upper outer buttocks Lot: [id number] Exp:05/2018  Patient tolerated well, no complications were noted  Preformed by: Eligha BridegroomSarah Watts, CMA  Follow up: 1 week

## 2016-12-05 ENCOUNTER — Ambulatory Visit (INDEPENDENT_AMBULATORY_CARE_PROVIDER_SITE_OTHER): Payer: BC Managed Care – PPO | Admitting: Urology

## 2016-12-05 DIAGNOSIS — E291 Testicular hypofunction: Secondary | ICD-10-CM

## 2016-12-05 MED ORDER — TESTOSTERONE CYPIONATE 200 MG/ML IM SOLN
200.0000 mg | Freq: Once | INTRAMUSCULAR | Status: AC
  Administered 2016-12-05: 200 mg via INTRAMUSCULAR

## 2016-12-05 NOTE — Progress Notes (Signed)
Testosterone IM Injection  Due to Hypogonadism patient is present today for a Testosterone Injection.  Medication: Testosterone Cypionate Dose: 0.305ml Location: Left upper outer buttocks Lot: [id number] Exp:05/2018  Patient tolerated well, no complications.  Preformed by: Dallas Schimkeamona Benecio Kluger CMA  Follow up: One week

## 2016-12-12 ENCOUNTER — Ambulatory Visit (INDEPENDENT_AMBULATORY_CARE_PROVIDER_SITE_OTHER): Payer: BC Managed Care – PPO | Admitting: Urology

## 2016-12-12 DIAGNOSIS — E291 Testicular hypofunction: Secondary | ICD-10-CM

## 2016-12-12 MED ORDER — TESTOSTERONE CYPIONATE 200 MG/ML IM SOLN
100.0000 mg | Freq: Once | INTRAMUSCULAR | Status: AC
  Administered 2016-12-12: 100 mg via INTRAMUSCULAR

## 2016-12-12 MED ORDER — TESTOSTERONE CYPIONATE 200 MG/ML IM SOLN: 200.0000 mg | Freq: Once | INTRAMUSCULAR | Status: DC

## 2016-12-12 NOTE — Progress Notes (Signed)
Testosterone IM Injection  Due to Hypogonadism patient is present today for a Testosterone Injection.  Medication: Testosterone Cypionate Dose: 0.675ml Location: Left upper outer buttocks(per patient request) Lot: [id number] Exp:05/2018  Patient tolerated well, no complications.  Preformed by: Eligha BridegroomSarah Ashten Sarnowski CMA  Follow up: One week

## 2016-12-26 ENCOUNTER — Ambulatory Visit (INDEPENDENT_AMBULATORY_CARE_PROVIDER_SITE_OTHER): Payer: BC Managed Care – PPO | Admitting: Urology

## 2016-12-26 DIAGNOSIS — E291 Testicular hypofunction: Secondary | ICD-10-CM

## 2016-12-26 MED ORDER — TESTOSTERONE CYPIONATE 200 MG/ML IM SOLN
200.0000 mg | Freq: Once | INTRAMUSCULAR | Status: AC
  Administered 2016-12-26: 200 mg via INTRAMUSCULAR

## 2016-12-26 NOTE — Progress Notes (Signed)
Testosterone IM Injection  Due to Hypogonadism patient is present today for a Testosterone Injection.  Medication: Testosterone Cypionate Dose: 0.1065ml Location: right upper outer buttocks Lot: [id number] Exp:05/2018  Patient tolerated well, no complications were noted  Preformed by: Teressa Lowerarrie Anairis Knick, CMA  Follow up: 1 week

## 2017-01-02 ENCOUNTER — Ambulatory Visit (INDEPENDENT_AMBULATORY_CARE_PROVIDER_SITE_OTHER): Payer: BC Managed Care – PPO | Admitting: Urology

## 2017-01-02 DIAGNOSIS — E291 Testicular hypofunction: Secondary | ICD-10-CM

## 2017-01-02 MED ORDER — TESTOSTERONE CYPIONATE 200 MG/ML IM SOLN
200.0000 mg | Freq: Once | INTRAMUSCULAR | Status: AC
  Administered 2017-01-02: 200 mg via INTRAMUSCULAR

## 2017-01-02 NOTE — Progress Notes (Signed)
Testosterone IM Injection  Due to Hypogonadism patient is present today for a Testosterone Injection.  Medication: Testosterone Cypionate Dose: 0.815ml Location: left upper outer buttocks Lot: [id number] Exp:05/2018  Patient tolerated well, no complications were noted  Preformed by: Dallas Schimkeamona Williams CMA  Follow up: One week

## 2017-01-09 ENCOUNTER — Ambulatory Visit: Payer: BC Managed Care – PPO | Admitting: Urology

## 2017-01-13 ENCOUNTER — Other Ambulatory Visit
Admission: RE | Admit: 2017-01-13 | Discharge: 2017-01-13 | Disposition: A | Discharge status: Home / Self Care | Payer: BC Managed Care – PPO | Source: Ambulatory Visit | Attending: Urology | Admitting: Urology

## 2017-01-13 DIAGNOSIS — E291 Testicular hypofunction: Secondary | ICD-10-CM | POA: Insufficient documentation

## 2017-01-13 LAB — HEMATOCRIT: HCT: 42.5 % (ref 40.0–52.0)

## 2017-01-14 ENCOUNTER — Telehealth: Payer: Self-pay

## 2017-01-14 LAB — TESTOSTERONE: Testosterone: 597 ng/dL (ref 264–916)

## 2017-01-14 NOTE — Telephone Encounter (Signed)
-----   Message from Harle BattiestShannon A McGowan, PA-C sent at 01/14/2017  7:54 AM EST ----- Please notify the patient that his labs are normal.  We will continue with present testosterone dose.  I will need to see him in three months for an office visit.  He will need HCT, PSA and a testosterone drawn one week after receiving his injection.

## 2017-01-14 NOTE — Telephone Encounter (Signed)
Spoke with pt in reference to lab results. Pt voiced understanding.  

## 2017-01-23 ENCOUNTER — Ambulatory Visit (INDEPENDENT_AMBULATORY_CARE_PROVIDER_SITE_OTHER): Payer: BC Managed Care – PPO | Admitting: Urology

## 2017-01-23 DIAGNOSIS — E291 Testicular hypofunction: Secondary | ICD-10-CM | POA: Diagnosis not present

## 2017-01-23 MED ORDER — TESTOSTERONE CYPIONATE 200 MG/ML IM SOLN
200.0000 mg | Freq: Once | INTRAMUSCULAR | Status: AC
Start: 2017-01-23 — End: 2017-01-23
  Administered 2017-01-23: 200 mg via INTRAMUSCULAR

## 2017-01-23 NOTE — Progress Notes (Signed)
Testosterone IM Injection  Due to Hypogonadism patient is present today for a Testosterone Injection.  Medication: Testosterone Cypionate Dose: 0.785ml Location: right upper outer buttocks Lot: [id number] Exp:05/2018 Patient tolerated well, no complications were noted  Preformed by: Dallas Schimkeamona Williams CMA  Follow up: One week

## 2017-01-30 ENCOUNTER — Ambulatory Visit (INDEPENDENT_AMBULATORY_CARE_PROVIDER_SITE_OTHER): Payer: BC Managed Care – PPO | Admitting: Urology

## 2017-01-30 DIAGNOSIS — E291 Testicular hypofunction: Secondary | ICD-10-CM | POA: Diagnosis not present

## 2017-01-30 MED ORDER — TESTOSTERONE CYPIONATE 200 MG/ML IM SOLN
200.0000 mg | Freq: Once | INTRAMUSCULAR | Status: AC
  Administered 2017-01-30: 200 mg via INTRAMUSCULAR

## 2017-01-30 NOTE — Progress Notes (Signed)
Testosterone IM Injection  Due to Hypogonadism patient is present today for a Testosterone Injection.  Medication: Testosterone Cypionate Dose: 0.5ml Location: left upper outer buttocks Lot: [id number] Exp:05/2018  Patient tolerated well, no complications were noted  Preformed by: Payzlee Ryder CMA  Follow up: One week 

## 2017-02-06 ENCOUNTER — Telehealth: Payer: Self-pay | Admitting: Urology

## 2017-02-06 ENCOUNTER — Ambulatory Visit (INDEPENDENT_AMBULATORY_CARE_PROVIDER_SITE_OTHER): Payer: BC Managed Care – PPO | Admitting: Urology

## 2017-02-06 DIAGNOSIS — E291 Testicular hypofunction: Secondary | ICD-10-CM | POA: Diagnosis not present

## 2017-02-06 MED ORDER — TESTOSTERONE CYPIONATE 200 MG/ML IM SOLN
100.0000 mg | Freq: Once | INTRAMUSCULAR | Status: AC
  Administered 2017-02-06: 100 mg via INTRAMUSCULAR

## 2017-02-06 NOTE — Telephone Encounter (Signed)
Okay to refill? 

## 2017-02-06 NOTE — Progress Notes (Signed)
Testosterone IM Injection  Due to Hypogonadism patient is present today for a Testosterone Injection.  Medication: Testosterone Cypionate Dose: 0.405ml Location: right upper outer buttocks Lot: [id number] Exp:05/2018  Patient tolerated well, no complications were noted  Preformed by: Eligha BridegroomSarah Marquasia Schmieder, CMA  Follow up: One week, patient was told that he had one bottle of medication left equal to two more injections and to think about getting a refill.

## 2017-02-06 NOTE — Telephone Encounter (Signed)
Patient needs a new prescription for testosterone injections called into the [facility] in [place].

## 2017-02-09 MED ORDER — TESTOSTERONE CYPIONATE 200 MG/ML IM SOLN: 200.0000 mg | INTRAMUSCULAR | 0 refills | Status: DC

## 2017-02-09 NOTE — Telephone Encounter (Signed)
Order faxed to pharmacy.

## 2017-02-12 ENCOUNTER — Telehealth: Payer: Self-pay

## 2017-02-12 NOTE — Telephone Encounter (Signed)
PA for testosterone APPROVED! 02/12/17-02/13/20.

## 2017-02-20 ENCOUNTER — Ambulatory Visit (INDEPENDENT_AMBULATORY_CARE_PROVIDER_SITE_OTHER): Payer: BC Managed Care – PPO | Admitting: Urology

## 2017-02-20 DIAGNOSIS — E291 Testicular hypofunction: Secondary | ICD-10-CM | POA: Diagnosis not present

## 2017-02-20 MED ORDER — TESTOSTERONE CYPIONATE 200 MG/ML IM SOLN
100.0000 mg | Freq: Once | INTRAMUSCULAR | Status: AC
  Administered 2017-02-20: 100 mg via INTRAMUSCULAR

## 2017-02-20 NOTE — Progress Notes (Signed)
Testosterone IM Injection  Due to Hypogonadism patient is present today for a Testosterone Injection.  Medication: Testosterone Cypionate Dose: 0.425ml Location: left upper outer buttocks Lot: [id number] Exp:05/2018  Patient tolerated well, no complications were noted  Preformed by: Eligha BridegroomSarah Watts, CMA

## 2017-02-27 ENCOUNTER — Ambulatory Visit (INDEPENDENT_AMBULATORY_CARE_PROVIDER_SITE_OTHER): Payer: BC Managed Care – PPO | Admitting: Urology

## 2017-02-27 DIAGNOSIS — E291 Testicular hypofunction: Secondary | ICD-10-CM

## 2017-02-27 MED ORDER — TESTOSTERONE CYPIONATE 200 MG/ML IM SOLN
200.0000 mg | Freq: Once | INTRAMUSCULAR | Status: AC
  Administered 2017-02-27: 200 mg via INTRAMUSCULAR

## 2017-02-27 NOTE — Progress Notes (Signed)
Testosterone IM Injection  Due to Hypogonadism patient is present today for a Testosterone Injection.  Medication: Testosterone Cypionate Dose:0.595ml 100 mg Location: Left upper outer buttocks Lot: [id number] Exp:05/2018  Patient tolerated well, no complications were noted  Preformed by: Dallas Schimkeamona Williams CMA  Follow up: One week

## 2017-03-06 ENCOUNTER — Ambulatory Visit (INDEPENDENT_AMBULATORY_CARE_PROVIDER_SITE_OTHER): Payer: BC Managed Care – PPO | Admitting: Urology

## 2017-03-06 DIAGNOSIS — E291 Testicular hypofunction: Secondary | ICD-10-CM | POA: Diagnosis not present

## 2017-03-06 MED ORDER — TESTOSTERONE CYPIONATE 200 MG/ML IM SOLN
100.0000 mg | Freq: Once | INTRAMUSCULAR | Status: AC
  Administered 2017-03-06: 100 mg via INTRAMUSCULAR

## 2017-03-06 NOTE — Progress Notes (Signed)
Testosterone IM Injection  Due to Hypogonadism patient is present today for a Testosterone Injection.  Medication: Testosterone Cypionate Dose:0.585ml 100 mg Location: right upper outer buttocks Lot: [id number] Exp:05/2018  Patient tolerated well, no complications were noted  Preformed by: Eligha BridegroomSarah Watts CMA  Follow up: One week

## 2017-03-13 ENCOUNTER — Ambulatory Visit (INDEPENDENT_AMBULATORY_CARE_PROVIDER_SITE_OTHER): Payer: BC Managed Care – PPO | Admitting: Urology

## 2017-03-13 DIAGNOSIS — E291 Testicular hypofunction: Secondary | ICD-10-CM

## 2017-03-13 MED ORDER — TESTOSTERONE CYPIONATE 200 MG/ML IM SOLN: 200.0000 mg | Freq: Once | INTRAMUSCULAR | Status: DC

## 2017-03-13 NOTE — Progress Notes (Signed)
Testosterone IM Injection  Due to Hypogonadism patient is present today for a Testosterone Injection.  Medication: Testosterone Cypionate Dose: 0.215ml  100mg  Location: Left upper outer buttocks Lot: [id number] Exp:08/2018  Patient tolerated well, no complications.  Preformed by: Dallas Schimkeamona Williams CMA  Follow up: One week

## 2017-03-25 ENCOUNTER — Telehealth: Payer: Self-pay

## 2017-03-25 DIAGNOSIS — E291 Testicular hypofunction: Secondary | ICD-10-CM

## 2017-03-25 MED ORDER — TESTOSTERONE CYPIONATE 200 MG/ML IM SOLN: 200.0000 mg | INTRAMUSCULAR | 0 refills | Status: DC

## 2017-03-25 NOTE — Telephone Encounter (Signed)
Spoke with pt in reference to needing labs and an OV. Pt stated that he has both scheduled at the end of the month. Please advise.

## 2017-03-25 NOTE — Telephone Encounter (Signed)
He needs an office visit with me.  He will need testosterone (one week after injection), HCT, PSA to be drawn before appointment

## 2017-03-25 NOTE — Telephone Encounter (Signed)
Pt called stating he is needing more testosterone injections. Pt has not had labs since January. Please advise.

## 2017-03-25 NOTE — Telephone Encounter (Signed)
Great! Refill the testosterone and we will see him at the end of the month.

## 2017-03-25 NOTE — Telephone Encounter (Signed)
Medication sent to pharmacy. Pt made aware 

## 2017-04-03 ENCOUNTER — Ambulatory Visit: Payer: BC Managed Care – PPO | Admitting: Urology

## 2017-04-08 ENCOUNTER — Telehealth: Payer: Self-pay | Admitting: Urology

## 2017-04-08 NOTE — Telephone Encounter (Signed)
Jeffrey Cook called office stating that he will not be able to make it to get his injection this Friday 04/10/17 due to being out of town.

## 2017-04-08 NOTE — Telephone Encounter (Signed)
Noted  

## 2017-04-23 ENCOUNTER — Other Ambulatory Visit: Payer: Self-pay | Admitting: *Deleted

## 2017-04-23 DIAGNOSIS — E291 Testicular hypofunction: Secondary | ICD-10-CM

## 2017-04-23 NOTE — Progress Notes (Signed)
04/24/2017 8:38 AM   [name]. November 16, 1966 [id number]  Referring provider: Orene Desanctis, MD [address] [place], [place] [address]  Chief Complaint  Patient presents with  . Hypogonadism    3 month follow up     HPI: Patient is a 51 year old Caucasian male with testosterone deficiency, erectile dysfunction and BPH with LUTS who presents today for a 6 month follow-up.  Testosterone deficiency Patient is experiencing a lack of energy and a decrease in strength.  This is indicated by his responses to the ADAM questionnaire.  He is still having spontaneous erections at night.   He does not have sleep apnea.   He has type 2 diabetes mellitus.  His most recent testosterone level was 597 ng/dL on 21/30/8657.  His HCT were normal at that time.  He is currently managing his hypogonadism with testosterone cypionate 200 mg/cc, 0.5 cc q weekly.        Androgen Deficiency in the Aging Male    Row Name 04/24/17 0800         Androgen Deficiency in the Aging Male   Do you have a decrease in libido (sex drive) No     Do you have lack of energy Yes     Do you have a decrease in strength and/or endurance Yes     Have you lost height No     Have you noticed a decreased "enjoyment of life" No     Are you sad and/or grumpy No     Are your erections less strong No     Have you noticed a recent deterioration in your ability to play sports No     Are you falling asleep after dinner No     Has there been a recent deterioration in your work performance No        Erectile dysfunction His SHIM score is 24, which is no ED.   His previous SHIM score was 24.   His libido is preserved   His risk factors for ED are age, BPH, hypogonadism, DM, HTN and HLD.  He denies any painful erections or curvatures with his erections.   He is still spontaneous erections.       SHIM    Row Name 04/24/17 [phone]         SHIM: Over the last 6 months:   How do you rate your confidence that you could get and  keep an erection? High     When you had erections with sexual stimulation, how often were your erections hard enough for penetration (entering your partner)? Almost Always or Always     During sexual intercourse, how often were you able to maintain your erection after you had penetrated (entered) your partner? Almost Always or Always     During sexual intercourse, how difficult was it to maintain your erection to completion of intercourse? Not Difficult     When you attempted sexual intercourse, how often was it satisfactory for you? Almost Always or Always       SHIM Total Score   SHIM 24        Score: 1-7 Severe ED 8-11 Moderate ED 12-16 Mild-Moderate ED 17-21 Mild ED 22-25 No ED   BPH WITH LUTS His IPSS score today is 4, which is mild lower urinary tract symptomatology. He is pleased with his quality life due to his urinary symptoms.  His previous IPSS score was 4/1.  His major complaints today are urgency and nocturia.  He has had these symptoms for few years.  He denies any dysuria, hematuria or suprapubic pain.   He also denies any recent fevers, chills, nausea or vomiting. He does not have a family history of PCa.      IPSS    Row Name 04/24/17 0800         International Prostate Symptom Score   How often have you had the sensation of not emptying your bladder? Not at All     How often have you had to urinate less than every two hours? Less than 1 in 5 times     How often have you found you stopped and started again several times when you urinated? Not at All     How often have you found it difficult to postpone urination? Less than 1 in 5 times     How often have you had a weak urinary stream? Not at All     How often have you had to strain to start urination? Not at All     How many times did you typically get up at night to urinate? 2 Times     Total IPSS Score 4       Quality of Life due to urinary symptoms   If you were to spend the rest of your life with your  urinary condition just the way it is now how would you feel about that? Pleased        Score:  1-7 Mild 8-19 Moderate 20-35 Severe    PMH: Past Medical History:  Diagnosis Date  . Allergic rhinitis   . BPH with obstruction/lower urinary tract symptoms   . Chronic headaches   . Diabetes mellitus without complication (HCC)   . Diverticulosis   . Dyslipidemia   . Erectile dysfunction   . Fatty liver   . GERD (gastroesophageal reflux disease)   . Hypogonadism in male   . Personal history of multiple concussions   . Steatohepatitis, non-alcoholic     Surgical History: Past Surgical History:  Procedure Laterality Date  . LIVER BIOPSY  06/10/2009  . MYELOGRAM    . NASAL SINUS SURGERY      Home Medications:  Allergies as of 04/24/2017      Reactions   Codeine    Fenofibrate Other (See Comments)   Fish Oil    Other reaction(s): Other (See Comments) Dyspepsia, foul taste, loose stools - tolerates Lovaza   Niacin And Related    Prednisone    Headache   Tricor [fenofibrate]       Medication List       Accurate as of 04/24/17  8:38 AM. Always use your most recent med list.          aspirin 81 MG tablet Take 81 mg by mouth daily.   atorvastatin 10 MG tablet Commonly known as:  LIPITOR Take 10 mg by mouth daily.   cholecalciferol 400 units Tabs tablet Commonly known as:  VITAMIN D Take 1,000 Units by mouth daily.   doxycycline 100 MG capsule Commonly known as:  VIBRAMYCIN   fenofibrate 160 MG tablet Take 160 mg by mouth daily.   fexofenadine-pseudoephedrine 180-240 MG 24 hr tablet Commonly known as:  ALLEGRA-D ALLERGY & CONGESTION Take 1 tablet by mouth daily.   fluticasone 50 MCG/ACT nasal spray Commonly known as:  FLONASE Place 2 sprays into both nostrils daily.   hydrocortisone cream-nystatin cream-zinc oxide Apply 1 application topically 2 (two) times daily.   lisinopril 5  MG tablet Commonly known as:  PRINIVIL,ZESTRIL Take 5 mg by mouth  daily.   metFORMIN 850 MG tablet Commonly known as:  GLUCOPHAGE Take 850 mg by mouth 2 (two) times daily with a meal.   nystatin-triamcinolone ointment Commonly known as:  MYCOLOG Apply 1 application topically 2 (two) times daily.   omega-3 acid ethyl esters 1 g capsule Commonly known as:  LOVAZA Take 1 g by mouth 2 (two) times daily.   testosterone 4 MG/24HR Pt24 patch Commonly known as:  ANDRODERM Apply 2 patches daily   testosterone cypionate 200 MG/ML injection Commonly known as:  DEPOTESTOSTERONE CYPIONATE INJECT 1 ML( 200 MG) INTO THE MUSCLE EVERY 14 DAYS   testosterone cypionate 200 MG/ML injection Commonly known as:  DEPOTESTOSTERONE CYPIONATE Inject 1 mL (200 mg total) into the muscle every 14 (fourteen) days.   vitamin B-12 100 MCG tablet Commonly known as:  CYANOCOBALAMIN Take 100 mcg by mouth daily.   vitamin E 400 UNIT capsule Take 800 Units by mouth daily.       Allergies:  Allergies  Allergen Reactions  . Codeine   . Fenofibrate Other (See Comments)  . Fish Oil     Other reaction(s): Other (See Comments) Dyspepsia, foul taste, loose stools - tolerates Lovaza  . Niacin And Related   . Prednisone     Headache  . Tricor [Fenofibrate]     Family History: Family History  Problem Relation Age of Onset  . Hypertension Mother   . Alcohol abuse Mother   . Diabetes Mother   . Stroke Mother   . Transient ischemic attack Father   . Hyperlipidemia Father   . Melanoma Father   . Heart attack Father   . Kidney cancer Neg Hx   . Prostate cancer Neg Hx   . Bladder Cancer Neg Hx     Social History:  reports that he has never smoked. He has never used smokeless tobacco. He reports that he drinks alcohol. He reports that he does not use drugs.  ROS: UROLOGY Frequent Urination?: No Hard to postpone urination?: Yes Burning/pain with urination?: No Get up at night to urinate?: Yes Leakage of urine?: No Urine stream starts and stops?: No Trouble  starting stream?: No Do you have to strain to urinate?: No Blood in urine?: No Urinary tract infection?: No Sexually transmitted disease?: No Injury to kidneys or bladder?: No Painful intercourse?: No Weak stream?: No Erection problems?: No Penile pain?: No  Gastrointestinal Nausea?: No Vomiting?: No Indigestion/heartburn?: No Diarrhea?: No Constipation?: No  Constitutional Fever: No Night sweats?: No Weight loss?: No Fatigue?: No  Skin Skin rash/lesions?: No Itching?: No  Eyes Blurred vision?: No Double vision?: No  Ears/Nose/Throat Sore throat?: No Sinus problems?: No  Hematologic/Lymphatic Swollen glands?: No Easy bruising?: No  Cardiovascular Leg swelling?: No Chest pain?: No  Respiratory Cough?: No Shortness of breath?: No  Endocrine Excessive thirst?: No  Musculoskeletal Back pain?: No Joint pain?: No  Neurological Headaches?: No Dizziness?: No  Psychologic Depression?: No Anxiety?: No  Physical Exam: BP 136/83   Pulse 70   Ht 5\' 11"  (1.803 m)   Wt 198 lb (89.8 kg)   BMI 27.62 kg/m   Constitutional: Well nourished. Alert and oriented, No acute distress. HEENT: [place] AT, moist mucus membranes. Trachea midline, no masses. Cardiovascular: No clubbing, cyanosis, or edema. Respiratory: Normal respiratory effort, no increased work of breathing. GI: Abdomen is soft, non tender, non distended, no abdominal masses. Liver and spleen not palpable.  No hernias  appreciated.  Stool sample for occult testing is not indicated.   GU: No CVA tenderness.  No bladder fullness or masses.  Patient with circumcised phallus.  Urethral meatus is patent.  No penile discharge. No penile lesions or rashes. Scrotum without lesions, cysts, rashes and/or edema.  Testicles are located scrotally bilaterally. No masses are appreciated in the testicles. Left and right epididymis are normal. Rectal: Patient with  normal sphincter tone. Anus and perineum without scarring  or rashes. No rectal masses are appreciated. Prostate is approximately 50 grams, no nodules are appreciated. Seminal vesicles are normal. Skin: No rashes, bruises or suspicious lesions. Lymph: No cervical or inguinal adenopathy. Neurologic: Grossly intact, no focal deficits, moving all 4 extremities. Psychiatric: Normal mood and affect.  Laboratory Data: Lab Results  Component Value Date   WBC 5.2 04/24/2017   HGB 16.1 04/24/2017   HCT 49.1 04/24/2017   MCV 79.3 (L) 04/24/2017   PLT 159 04/24/2017    Lab Results  Component Value Date   CREATININE 1.25 06/15/2012     Lab Results  Component Value Date   TESTOSTERONE 597 01/13/2017     Lab Results  Component Value Date   TSH 1.200 04/07/2016       Component Value Date/Time   CHOL 139 04/07/2016 0812   HDL 30 (L) 04/07/2016 0812   CHOLHDL 4.6 04/07/2016 0812   LDLCALC 68 04/07/2016 0812    Lab Results  Component Value Date   AST 21 04/07/2016   Lab Results  Component Value Date   ALT 23 04/07/2016    Assessment & Plan:    1. Testosterone deficiency   -most recent testosterone level is 597ng/dL on 09/81/191401/23/2018 (goal [phone] ng/dL)  -continue testosterone cypionate 200 mg/cc, 0.5 cc q week  -RTC in 6 months for HCT, testosterone, PSA, ADAM and exam  2. BPH with LUTS  - IPSS score is 4/0, it is stable  - Continue conservative management, avoiding bladder irritants and timed voiding's  - RTC in 6 months for IPSS, PSA and exam, as testosterone therapy can cause prostate enlargement and worsen LUTS  3. Erectile dysfunction:     -SHIM score is 24  -RTC in 6 months for SHIM score and exam, as testosterone therapy can affect erections   Return in about 6 months (around 10/25/2017) for PSA,  HCT, testosterone, ADAM, IPSS, SHIM and exam.  These notes generated with voice recognition software. I apologize for typographical errors.  Michiel CowboySHANNON Sebastiana Wuest, PA-C  [facility] [address],  [address] [place], [place] [address] [phone]

## 2017-04-24 ENCOUNTER — Ambulatory Visit (INDEPENDENT_AMBULATORY_CARE_PROVIDER_SITE_OTHER): Payer: BC Managed Care – PPO | Admitting: Urology

## 2017-04-24 ENCOUNTER — Encounter: Payer: Self-pay | Admitting: Urology

## 2017-04-24 ENCOUNTER — Other Ambulatory Visit
Admission: RE | Admit: 2017-04-24 | Discharge: 2017-04-24 | Disposition: A | Discharge status: Home / Self Care | Payer: BC Managed Care – PPO | Source: Ambulatory Visit | Attending: Urology | Admitting: Urology

## 2017-04-24 VITALS — BP 136/83 | HR 70 | Ht 71.0 in | Wt 198.0 lb

## 2017-04-24 DIAGNOSIS — E291 Testicular hypofunction: Secondary | ICD-10-CM

## 2017-04-24 DIAGNOSIS — N138 Other obstructive and reflux uropathy: Secondary | ICD-10-CM | POA: Diagnosis not present

## 2017-04-24 DIAGNOSIS — E349 Endocrine disorder, unspecified: Secondary | ICD-10-CM

## 2017-04-24 DIAGNOSIS — N401 Enlarged prostate with lower urinary tract symptoms: Secondary | ICD-10-CM | POA: Diagnosis not present

## 2017-04-24 DIAGNOSIS — N529 Male erectile dysfunction, unspecified: Secondary | ICD-10-CM

## 2017-04-24 LAB — CBC WITH DIFFERENTIAL/PLATELET
BASOS ABS: 0.1 10*3/uL (ref 0–0.1)
BASOS PCT: 1 %
EOS ABS: 0.1 10*3/uL (ref 0–0.7)
Eosinophils Relative: 2 %
HEMATOCRIT: 49.1 % (ref 40.0–52.0)
Hemoglobin: 16.1 g/dL (ref 13.0–18.0)
Lymphocytes Relative: 45 %
Lymphs Abs: 2.3 10*3/uL (ref 1.0–3.6)
MCH: 26.1 pg (ref 26.0–34.0)
MCHC: 32.9 g/dL (ref 32.0–36.0)
MCV: 79.3 fL — ABNORMAL LOW (ref 80.0–100.0)
MONO ABS: 0.4 10*3/uL (ref 0.2–1.0)
Monocytes Relative: 8 %
NEUTROS ABS: 2.3 10*3/uL (ref 1.4–6.5)
NEUTROS PCT: 44 %
PLATELETS: 159 10*3/uL (ref 150–440)
RBC: 6.19 MIL/uL — ABNORMAL HIGH (ref 4.40–5.90)
RDW: 15.4 % — AB (ref 11.5–14.5)
WBC: 5.2 10*3/uL (ref 3.8–10.6)

## 2017-04-24 LAB — PSA: PSA: 0.22 ng/mL (ref 0.00–4.00)

## 2017-04-24 MED ORDER — TESTOSTERONE CYPIONATE 200 MG/ML IM SOLN
200.0000 mg | Freq: Once | INTRAMUSCULAR | Status: AC
  Administered 2017-04-24: 200 mg via INTRAMUSCULAR

## 2017-04-24 NOTE — Progress Notes (Signed)
Testosterone IM Injection  Due to Hypogonadism patient is present today for a Testosterone Injection.  Medication: Testosterone Cypionate Dose: 0.5 ml  100mg  Location: Left upper outer buttocks Lot: [id number] Exp:03/2018  Patient tolerated well, no complications  Preformed by: Dallas Schimkeamona Williams CMA  Follow up: One week

## 2017-04-25 LAB — TESTOSTERONE: TESTOSTERONE: 157 ng/dL — AB (ref 264–916)

## 2017-04-28 ENCOUNTER — Telehealth: Payer: Self-pay | Admitting: *Deleted

## 2017-04-28 DIAGNOSIS — E291 Testicular hypofunction: Secondary | ICD-10-CM

## 2017-04-28 NOTE — Telephone Encounter (Signed)
-----   Message from Harle BattiestShannon A McGowan, PA-C sent at 04/26/2017  8:10 PM EDT ----- Please let patient know that his testosterone level was low.  How long had it been since his last injection?  His CBC was also abnormal, but this may be due to his fatty liver.  We need to fax those results to his PCP.

## 2017-04-28 NOTE — Telephone Encounter (Signed)
Spoke to patient and gave results. He only missed the injection on April 27th only. Per [name] have Testosterone drawn again in one month in [place]. Orders placed and CBC sent to [facility]CP [facility]. Patient ok with plan.

## 2017-04-28 NOTE — Telephone Encounter (Signed)
LMOM for patient to return call about recent lab results.

## 2017-05-01 ENCOUNTER — Ambulatory Visit (INDEPENDENT_AMBULATORY_CARE_PROVIDER_SITE_OTHER): Payer: BC Managed Care – PPO | Admitting: Urology

## 2017-05-01 DIAGNOSIS — E291 Testicular hypofunction: Secondary | ICD-10-CM | POA: Diagnosis not present

## 2017-05-01 MED ORDER — TESTOSTERONE CYPIONATE 200 MG/ML IM SOLN
100.0000 mg | Freq: Once | INTRAMUSCULAR | Status: AC
  Administered 2017-05-01: 100 mg via INTRAMUSCULAR

## 2017-05-01 NOTE — Progress Notes (Signed)
Testosterone IM Injection  Due to Hypogonadism patient is present today for a Testosterone Injection.  Medication: Testosterone Cypionate Dose: 0.5 ml  100mg  Location: Right upper outer buttocks Lot: [id number] Exp:03/2018  Patient tolerated well, no complications  Preformed by: Eligha BridegroomSarah Savon Bordonaro, CMA  Follow up: One week

## 2017-05-08 ENCOUNTER — Ambulatory Visit (INDEPENDENT_AMBULATORY_CARE_PROVIDER_SITE_OTHER): Payer: BC Managed Care – PPO | Admitting: Urology

## 2017-05-08 DIAGNOSIS — E291 Testicular hypofunction: Secondary | ICD-10-CM | POA: Diagnosis not present

## 2017-05-08 MED ORDER — TESTOSTERONE CYPIONATE 200 MG/ML IM SOLN
200.0000 mg | Freq: Once | INTRAMUSCULAR | Status: AC
  Administered 2017-05-08: 200 mg via INTRAMUSCULAR

## 2017-05-08 NOTE — Progress Notes (Signed)
Testosterone IM Injection  Due to Hypogonadism patient is present today for a Testosterone Injection.  Medication: Testosterone Cypionate Dose: 0.1085ml  100mg  Location: Left upper outer buttocks Lot: [id number] Exp:03/2018  Patient tolerated well, no complications.  Preformed by: Dallas Schimkeamona Williams CMA  Follow up: One week

## 2017-05-15 ENCOUNTER — Ambulatory Visit: Payer: BC Managed Care – PPO | Admitting: Urology

## 2017-05-22 ENCOUNTER — Ambulatory Visit: Payer: BC Managed Care – PPO | Admitting: Urology

## 2017-05-29 ENCOUNTER — Other Ambulatory Visit
Admission: RE | Admit: 2017-05-29 | Discharge: 2017-05-29 | Disposition: A | Discharge status: Home / Self Care | Payer: BC Managed Care – PPO | Source: Ambulatory Visit | Attending: Urology | Admitting: Urology

## 2017-05-29 ENCOUNTER — Ambulatory Visit (INDEPENDENT_AMBULATORY_CARE_PROVIDER_SITE_OTHER): Payer: BC Managed Care – PPO | Admitting: Urology

## 2017-05-29 DIAGNOSIS — E291 Testicular hypofunction: Secondary | ICD-10-CM

## 2017-05-29 MED ORDER — TESTOSTERONE CYPIONATE 200 MG/ML IM SOLN
100.0000 mg | Freq: Once | INTRAMUSCULAR | Status: AC
  Administered 2017-05-29: 100 mg via INTRAMUSCULAR

## 2017-05-29 NOTE — Progress Notes (Signed)
Testosterone IM Injection  Due to Hypogonadism patient is present today for a Testosterone Injection.  Medication: Testosterone Cypionate Dose: 0.105ml  100mg  Location: right upper outer buttocks Lot: [id number] Exp:03/2018  Patient tolerated well, no complications.  Preformed by: Eligha BridegroomSarah Eligio Angert CMA  Follow up: One week

## 2017-05-30 LAB — TESTOSTERONE: Testosterone: 242 ng/dL — ABNORMAL LOW (ref 264–916)

## 2017-06-05 ENCOUNTER — Ambulatory Visit (INDEPENDENT_AMBULATORY_CARE_PROVIDER_SITE_OTHER): Payer: BC Managed Care – PPO | Admitting: Urology

## 2017-06-05 DIAGNOSIS — E291 Testicular hypofunction: Secondary | ICD-10-CM

## 2017-06-05 MED ORDER — TESTOSTERONE CYPIONATE 200 MG/ML IM SOLN
100.0000 mg | Freq: Once | INTRAMUSCULAR | Status: AC
  Administered 2017-06-05: 100 mg via INTRAMUSCULAR

## 2017-06-05 NOTE — Progress Notes (Signed)
Testosterone IM Injection  Due to Hypogonadism patient is present today for a Testosterone Injection.  Medication: Testosterone Cypionate Dose: 0.185ml 100mg  Location: leftupper outer buttocks Lot: [id number] Exp:03/2018  Patient tolerated well, no complications.  Preformed by: Eligha BridegroomSarah Watts CMA  Follow up: One week

## 2017-06-08 ENCOUNTER — Telehealth: Payer: Self-pay | Admitting: *Deleted

## 2017-06-08 NOTE — Telephone Encounter (Signed)
-----   Message from Harle BattiestShannon A McGowan, PA-C sent at 06/08/2017  8:13 AM EDT ----- Mr. Gemmill's testosterone is still below therapeutic levels.  We can increase his testosterone 200 mg/IM to 1 cc every week and recheck his testosterone level in one month.

## 2017-06-08 NOTE — Telephone Encounter (Signed)
Spoke with patient and let him know results. Patient will increase injections to 1 cc every week. Patient states he will not be in on Friday for injection because he will be out of town but will come in [place] on Monday for injection.

## 2017-06-12 ENCOUNTER — Ambulatory Visit: Payer: BC Managed Care – PPO | Admitting: Urology

## 2017-06-15 ENCOUNTER — Ambulatory Visit: Payer: BC Managed Care – PPO

## 2017-06-19 ENCOUNTER — Ambulatory Visit: Payer: BC Managed Care – PPO | Admitting: Urology

## 2017-06-29 ENCOUNTER — Ambulatory Visit (INDEPENDENT_AMBULATORY_CARE_PROVIDER_SITE_OTHER): Payer: BC Managed Care – PPO

## 2017-06-29 DIAGNOSIS — E291 Testicular hypofunction: Secondary | ICD-10-CM

## 2017-06-29 MED ORDER — TESTOSTERONE CYPIONATE 200 MG/ML IM SOLN
200.0000 mg | Freq: Once | INTRAMUSCULAR | Status: AC
  Administered 2017-06-29: 200 mg via INTRAMUSCULAR

## 2017-06-29 NOTE — Progress Notes (Addendum)
Testosterone IM Injection  Due to Hypogonadism patient is present today for a Testosterone Injection.  Medication: Testosterone Cypionate Dose: 1 ml Location: left upper outer buttocks ( per pt request) Lot: [id number] Exp:03/2018  Patient tolerated well, no complications were noted  Performed by: C. Rana SnareLowe, CMA  Follow up: 1 week follow up. Advised pt per [name] recheck his testosterone level in one month. Placed lab orders for [place] office.

## 2017-06-30 NOTE — Addendum Note (Signed)
Addended by: Ellin GoodieLOWE, Roarke Marciano S on: 06/30/2017 08:11 AM   Modules accepted: Orders

## 2017-07-03 ENCOUNTER — Ambulatory Visit (INDEPENDENT_AMBULATORY_CARE_PROVIDER_SITE_OTHER): Payer: BC Managed Care – PPO | Admitting: Urology

## 2017-07-03 DIAGNOSIS — E291 Testicular hypofunction: Secondary | ICD-10-CM | POA: Diagnosis not present

## 2017-07-03 MED ORDER — TESTOSTERONE CYPIONATE 200 MG/ML IM SOLN
200.0000 mg | Freq: Once | INTRAMUSCULAR | Status: AC
  Administered 2017-07-03: 200 mg via INTRAMUSCULAR

## 2017-07-03 NOTE — Progress Notes (Signed)
Testosterone IM Injection  Due to Hypogonadism patient is present today for a Testosterone Injection.  Medication: Testosterone Cypionate Dose: 1 ml Location: left upper outer buttocks ( per pt request) Lot: [id number] Exp:03/2018  Patient tolerated well, no complications were noted  Performed by: Eligha BridegroomSarah Myha Arizpe, CMA  Follow up: 1 week

## 2017-07-10 ENCOUNTER — Ambulatory Visit (INDEPENDENT_AMBULATORY_CARE_PROVIDER_SITE_OTHER): Payer: BC Managed Care – PPO | Admitting: Urology

## 2017-07-10 DIAGNOSIS — E291 Testicular hypofunction: Secondary | ICD-10-CM

## 2017-07-10 MED ORDER — TESTOSTERONE CYPIONATE 200 MG/ML IM SOLN
200.0000 mg | Freq: Once | INTRAMUSCULAR | Status: AC
  Administered 2017-07-10: 200 mg via INTRAMUSCULAR

## 2017-07-10 NOTE — Progress Notes (Signed)
Testosterone IM Injection  Due to Hypogonadism patient is present today for a Testosterone Injection.  Medication: Testosterone Cypionate Dose: 1 ml Location: Rightupper outer buttocks  Lot: [id number] Exp:03/2018  Patient tolerated well, no complications were noted  Performed by: Eligha BridegroomSarah Barry [place], CMA  Follow up: 1 week

## 2017-07-17 ENCOUNTER — Ambulatory Visit (INDEPENDENT_AMBULATORY_CARE_PROVIDER_SITE_OTHER): Payer: BC Managed Care – PPO | Admitting: Urology

## 2017-07-17 DIAGNOSIS — E291 Testicular hypofunction: Secondary | ICD-10-CM | POA: Diagnosis not present

## 2017-07-17 MED ORDER — TESTOSTERONE CYPIONATE 200 MG/ML IM SOLN
200.0000 mg | Freq: Once | INTRAMUSCULAR | Status: AC
  Administered 2017-07-17: 200 mg via INTRAMUSCULAR

## 2017-07-17 NOTE — Progress Notes (Signed)
Testosterone IM Injection  Due to Hypogonadism patient is present today for a Testosterone Injection.  Medication: Testosterone Cypionate Dose: 1 ml Location: left upper outer buttocks  Lot: [id number] Exp:03/2018  Patient tolerated well, no complications were noted  Performed by: Eligha BridegroomSarah Amelia Macken, CMA  Follow up: 1 week

## 2017-07-24 ENCOUNTER — Ambulatory Visit (INDEPENDENT_AMBULATORY_CARE_PROVIDER_SITE_OTHER): Payer: BC Managed Care – PPO | Admitting: Urology

## 2017-07-24 ENCOUNTER — Other Ambulatory Visit
Admission: RE | Admit: 2017-07-24 | Discharge: 2017-07-24 | Disposition: A | Discharge status: Home / Self Care | Payer: BC Managed Care – PPO | Source: Ambulatory Visit | Attending: Urology | Admitting: Urology

## 2017-07-24 VITALS — BP 133/88 | HR 74 | Ht 71.5 in

## 2017-07-24 DIAGNOSIS — E349 Endocrine disorder, unspecified: Secondary | ICD-10-CM

## 2017-07-24 DIAGNOSIS — E291 Testicular hypofunction: Secondary | ICD-10-CM

## 2017-07-24 MED ORDER — TESTOSTERONE CYPIONATE 200 MG/ML IM SOLN: 200.0000 mg | INTRAMUSCULAR | 0 refills | Status: DC

## 2017-07-24 MED ORDER — TESTOSTERONE CYPIONATE 200 MG/ML IM SOLN
200.0000 mg | Freq: Once | INTRAMUSCULAR | Status: AC
  Administered 2017-07-24: 200 mg via INTRAMUSCULAR

## 2017-07-24 NOTE — Progress Notes (Signed)
Testosterone IM Injection  Due to Hypogonadism patient is present today for a Testosterone Injection.  Medication: Testosterone Cypionate Dose: 1ml Location: Right upper outer buttocks Lot: [id number] Exp:03/2018  Patient tolerated well, no complications  Preformed by: Dallas Schimkeamona Ariez Neilan CMA  Follow up: One week, patient needs refill on medication will bring to Michiel CowboyShannon McGowan PA-C attention. Vial working with last filled in April of this year.

## 2017-07-25 LAB — TESTOSTERONE: Testosterone: 1111 ng/dL — ABNORMAL HIGH (ref 264–916)

## 2017-07-30 ENCOUNTER — Telehealth: Payer: Self-pay

## 2017-07-30 ENCOUNTER — Telehealth: Payer: Self-pay | Admitting: Urology

## 2017-07-30 NOTE — Telephone Encounter (Signed)
Pt's testosterone level is 1011 from results last Friday and he is scheduled for injection tomorrow.  He wants to know if his amount of medicine should be dropped.  Please give him a call.  [phone]

## 2017-07-30 NOTE — Telephone Encounter (Signed)
Spoke with pt in reference to testosterone levels. Made aware tomorrow will receive 0.365mL per Dr. Apolinar JunesBrandon and then next week [name] will make final decision. Pt voiced understanding of whole conversation.

## 2017-07-30 NOTE — Telephone Encounter (Signed)
Per Dr. Apolinar JunesBrandon pt will get 0.975mL of testosterone at next injection. When [name] returns she will titrate medication accordingly.

## 2017-07-31 ENCOUNTER — Ambulatory Visit (INDEPENDENT_AMBULATORY_CARE_PROVIDER_SITE_OTHER): Payer: BC Managed Care – PPO | Admitting: Family Medicine

## 2017-07-31 DIAGNOSIS — E291 Testicular hypofunction: Secondary | ICD-10-CM | POA: Diagnosis not present

## 2017-07-31 MED ORDER — TESTOSTERONE CYPIONATE 200 MG/ML IM SOLN
200.0000 mg | Freq: Once | INTRAMUSCULAR | Status: AC
  Administered 2017-07-31: 100 mg via INTRAMUSCULAR

## 2017-07-31 NOTE — Progress Notes (Signed)
Testosterone IM Injection  Due to Hypogonadism patient is present today for a Testosterone Injection.  Medication: Testosterone Cypionate Dose: 1/2 ML Location: left upper outer buttocks Lot: [id number] Exp:03/2018  Patient tolerated well, no complications were noted  Preformed by: Teressa Lowerarrie Darrow Barreiro, CMA  Follow up: 1 week

## 2017-08-05 ENCOUNTER — Telehealth: Payer: Self-pay

## 2017-08-05 DIAGNOSIS — E291 Testicular hypofunction: Secondary | ICD-10-CM

## 2017-08-05 NOTE — Telephone Encounter (Signed)
LMOM

## 2017-08-05 NOTE — Telephone Encounter (Signed)
-----   Message from Harle BattiestShannon A McGowan, PA-C sent at 08/03/2017  9:23 AM EDT ----- Please let Mr. Jeffrey Cook know that his testosterone is above normal.  We will have to reduce his dose to 1 cc every two weeks.  We will need to repeat the testosterone and obtain HCT/HBG levels prior to restarting the injections.  He may want to switch to AVEED.

## 2017-08-05 NOTE — Telephone Encounter (Signed)
Please see my previous note.  I want the patient to have repeated blood work prior to any more injections.  I need a testosterone and HBG/HCT level.

## 2017-08-05 NOTE — Telephone Encounter (Signed)
Spoke with pt in reference to testosterone levels and injections. Pt stated he is having a lot of joint pain which normally means his testosterone levels are to high. Pt requested to skip an injection this week. Pt also inquired about having .75mL qweek instead of 1mL q2weeks. Please advise.

## 2017-08-06 NOTE — Telephone Encounter (Signed)
Spoke with pt in reference to testosterone injections. Made aware will need labs prior to any more injections. Pt voiced understanding. Labs ordered for [place] location.

## 2017-08-07 ENCOUNTER — Other Ambulatory Visit
Admission: RE | Admit: 2017-08-07 | Discharge: 2017-08-07 | Disposition: A | Discharge status: Home / Self Care | Payer: BC Managed Care – PPO | Source: Ambulatory Visit | Attending: Urology | Admitting: Urology

## 2017-08-07 ENCOUNTER — Ambulatory Visit: Payer: BC Managed Care – PPO | Admitting: Urology

## 2017-08-07 DIAGNOSIS — E291 Testicular hypofunction: Secondary | ICD-10-CM

## 2017-08-07 LAB — HEMOGLOBIN AND HEMATOCRIT, BLOOD
HCT: 43 % (ref 40.0–52.0)
Hemoglobin: 14.2 g/dL (ref 13.0–18.0)

## 2017-08-08 LAB — TESTOSTERONE: Testosterone: 684 ng/dL (ref 264–916)

## 2017-08-10 ENCOUNTER — Telehealth: Payer: Self-pay | Admitting: Family Medicine

## 2017-08-10 NOTE — Telephone Encounter (Signed)
-----   Message from Harle Battiest, PA-C sent at 08/09/2017  9:11 PM EDT ----- Please let Jeffrey Cook know that his testosterone has returned to normal levels.  His HCT/HBG are also normal.  We can start 0.75 mg q week next week.  We will need to check his testosterone level again after his fourth injection.

## 2017-08-10 NOTE — Telephone Encounter (Signed)
Patient notified and a note was placed on this weeks injection for reminder.

## 2017-08-14 ENCOUNTER — Ambulatory Visit (INDEPENDENT_AMBULATORY_CARE_PROVIDER_SITE_OTHER): Payer: BC Managed Care – PPO | Admitting: Urology

## 2017-08-14 DIAGNOSIS — E291 Testicular hypofunction: Secondary | ICD-10-CM | POA: Diagnosis not present

## 2017-08-14 MED ORDER — TESTOSTERONE CYPIONATE 200 MG/ML IM SOLN
150.0000 mg | Freq: Once | INTRAMUSCULAR | Status: AC
  Administered 2017-08-14: 150 mg via INTRAMUSCULAR

## 2017-08-14 NOTE — Progress Notes (Signed)
Testosterone IM Injection  Due to Hypogonadism patient is present today for a Testosterone Injection.  Medication: Testosterone Cypionate Dose: 0.75 ML Location: left upper outer buttocks Lot: [id number] Exp:03/2018  Patient tolerated well, no complications were noted  Preformed by: Eligha Bridegroom, CMA  Follow up: 1 week

## 2017-08-21 ENCOUNTER — Ambulatory Visit (INDEPENDENT_AMBULATORY_CARE_PROVIDER_SITE_OTHER): Payer: BC Managed Care – PPO | Admitting: Urology

## 2017-08-21 DIAGNOSIS — E291 Testicular hypofunction: Secondary | ICD-10-CM

## 2017-08-21 MED ORDER — TESTOSTERONE CYPIONATE 200 MG/ML IM SOLN
200.0000 mg | Freq: Once | INTRAMUSCULAR | Status: AC
  Administered 2017-08-21: 200 mg via INTRAMUSCULAR

## 2017-08-21 NOTE — Progress Notes (Signed)
Testosterone IM Injection  Due to Hypogonadism patient is present today for a Testosterone Injection.  Medication: Testosterone Cypionate Dose: 0.75 ml Location: Left upper outer buttocks Lot: [id number] Exp:03/2018  Patient tolerated well.  Preformed by: Dallas Schimkeamona Williams CMA  Follow up: One week

## 2017-08-28 ENCOUNTER — Ambulatory Visit (INDEPENDENT_AMBULATORY_CARE_PROVIDER_SITE_OTHER): Payer: BC Managed Care – PPO | Admitting: Urology

## 2017-08-28 DIAGNOSIS — E291 Testicular hypofunction: Secondary | ICD-10-CM

## 2017-08-28 MED ORDER — TESTOSTERONE CYPIONATE 200 MG/ML IM SOLN
200.0000 mg | Freq: Once | INTRAMUSCULAR | Status: AC
  Administered 2017-08-28: 200 mg via INTRAMUSCULAR

## 2017-08-28 NOTE — Progress Notes (Signed)
Testosterone IM Injection  Due to Hypogonadism patient is present today for a Testosterone Injection.  Medication: Testosterone Cypionate Dose: 0.7075ml Location: Right upper outer buttocks Lot: [id number] Exp:03/2018  Patient tolerated well, no complications   Preformed by: Dallas Schimkeamona Williams CMA  Follow up: One week

## 2017-09-04 ENCOUNTER — Ambulatory Visit: Payer: BC Managed Care – PPO | Admitting: Urology

## 2017-09-07 ENCOUNTER — Ambulatory Visit: Payer: BC Managed Care – PPO

## 2017-09-09 ENCOUNTER — Other Ambulatory Visit: Payer: Self-pay | Admitting: *Deleted

## 2017-09-09 DIAGNOSIS — E291 Testicular hypofunction: Secondary | ICD-10-CM

## 2017-09-11 ENCOUNTER — Other Ambulatory Visit
Admission: RE | Admit: 2017-09-11 | Discharge: 2017-09-11 | Disposition: A | Discharge status: Home / Self Care | Payer: BC Managed Care – PPO | Source: Ambulatory Visit | Attending: Urology | Admitting: Urology

## 2017-09-11 ENCOUNTER — Ambulatory Visit (INDEPENDENT_AMBULATORY_CARE_PROVIDER_SITE_OTHER): Payer: BC Managed Care – PPO | Admitting: Urology

## 2017-09-11 DIAGNOSIS — E291 Testicular hypofunction: Secondary | ICD-10-CM | POA: Insufficient documentation

## 2017-09-11 DIAGNOSIS — E349 Endocrine disorder, unspecified: Secondary | ICD-10-CM | POA: Diagnosis not present

## 2017-09-11 MED ORDER — TESTOSTERONE CYPIONATE 200 MG/ML IM SOLN
200.0000 mg | Freq: Once | INTRAMUSCULAR | Status: AC
  Administered 2017-09-11: 200 mg via INTRAMUSCULAR

## 2017-09-11 NOTE — Progress Notes (Signed)
Testosterone IM Injection  Due to Hypogonadism patient is present today for a Testosterone Injection.  Medication: Testosterone Cypionate Dose: 0.75 ml Location: Left upper outer buttocks Lot: [id number] Exp:03/2018  Patient tolerated well.  Preformed by: Madyn Ivins CMA  Follow up: One week 

## 2017-09-12 LAB — TESTOSTERONE: TESTOSTERONE: 202 ng/dL — AB (ref 264–916)

## 2017-09-14 ENCOUNTER — Telehealth: Payer: Self-pay

## 2017-09-14 ENCOUNTER — Telehealth: Payer: Self-pay | Admitting: Urology

## 2017-09-14 DIAGNOSIS — E291 Testicular hypofunction: Secondary | ICD-10-CM

## 2017-09-14 NOTE — Telephone Encounter (Signed)
Pt wants to know if he should wait to have 4 consecutive shots before labs.  Please advise.

## 2017-09-14 NOTE — Telephone Encounter (Signed)
It typically takes 4 injections to reach steady state.

## 2017-09-14 NOTE — Telephone Encounter (Signed)
-----   Message from Harle Battiest, PA-C sent at 09/14/2017  7:56 AM EDT ----- Please tell [name] that his testosterone is low as we expected.  I would recheck it again one week after his fourth injection.

## 2017-09-14 NOTE — Telephone Encounter (Signed)
Patient notified lab order placed for [place] lab

## 2017-09-15 NOTE — Telephone Encounter (Signed)
PAtient notified.

## 2017-09-18 ENCOUNTER — Ambulatory Visit: Payer: BC Managed Care – PPO | Admitting: Urology

## 2017-09-18 ENCOUNTER — Ambulatory Visit (INDEPENDENT_AMBULATORY_CARE_PROVIDER_SITE_OTHER): Payer: BC Managed Care – PPO | Admitting: Urology

## 2017-09-18 DIAGNOSIS — E291 Testicular hypofunction: Secondary | ICD-10-CM

## 2017-09-18 MED ORDER — TESTOSTERONE CYPIONATE 200 MG/ML IM SOLN
200.0000 mg | Freq: Once | INTRAMUSCULAR | Status: AC
  Administered 2017-09-18: 200 mg via INTRAMUSCULAR

## 2017-09-18 NOTE — Progress Notes (Signed)
Testosterone IM Injection  Due to Hypogonadism patient is present today for a Testosterone Injection.  Medication: Testosterone Cypionate Dose: 0.75ml Location: Right upper outer buttocks Lot: [id number] Exp:03/2018  Patient tolerated well, no complications   Preformed by: Ramona Williams CMA  Follow up: One week 

## 2017-09-23 DIAGNOSIS — G43009 Migraine without aura, not intractable, without status migrainosus: Secondary | ICD-10-CM | POA: Insufficient documentation

## 2017-09-25 ENCOUNTER — Ambulatory Visit (INDEPENDENT_AMBULATORY_CARE_PROVIDER_SITE_OTHER): Payer: BC Managed Care – PPO | Admitting: Urology

## 2017-09-25 DIAGNOSIS — E291 Testicular hypofunction: Secondary | ICD-10-CM | POA: Diagnosis not present

## 2017-09-25 MED ORDER — TESTOSTERONE CYPIONATE 200 MG/ML IM SOLN
150.0000 mg | Freq: Once | INTRAMUSCULAR | Status: AC
  Administered 2017-09-25: 150 mg via INTRAMUSCULAR

## 2017-09-25 NOTE — Progress Notes (Signed)
Testosterone IM Injection  Due to Hypogonadism patient is present today for a Testosterone Injection.  Medication: Testosterone Cypionate Dose: 0.60ml  Location: Left upper outer buttocks Lot: [id number] Exp:03/2018  Patient tolerated well, no complications.  Preformed by: Eligha Bridegroom CMA  Follow up: One week

## 2017-10-02 ENCOUNTER — Ambulatory Visit (INDEPENDENT_AMBULATORY_CARE_PROVIDER_SITE_OTHER): Payer: BC Managed Care – PPO | Admitting: Urology

## 2017-10-02 DIAGNOSIS — E291 Testicular hypofunction: Secondary | ICD-10-CM

## 2017-10-02 MED ORDER — TESTOSTERONE CYPIONATE 200 MG/ML IM SOLN
200.0000 mg | Freq: Once | INTRAMUSCULAR | Status: AC
  Administered 2017-10-02: 200 mg via INTRAMUSCULAR

## 2017-10-02 NOTE — Progress Notes (Signed)
Testosterone IM Injection  Due to Hypogonadism patient is present today for a Testosterone Injection.  Medication: Testosterone Cypionate Dose: 0.49ml  Location: Rightupper outer buttocks Lot: [id number] Exp:03/2018  Patient tolerated well, no complications.  Preformed by: Eligha Bridegroom CMA  Follow up: One week

## 2017-10-08 ENCOUNTER — Other Ambulatory Visit: Payer: Self-pay | Admitting: *Deleted

## 2017-10-08 DIAGNOSIS — R200 Anesthesia of skin: Secondary | ICD-10-CM | POA: Insufficient documentation

## 2017-10-08 DIAGNOSIS — R202 Paresthesia of skin: Secondary | ICD-10-CM | POA: Insufficient documentation

## 2017-10-08 DIAGNOSIS — E291 Testicular hypofunction: Secondary | ICD-10-CM

## 2017-10-08 DIAGNOSIS — M25562 Pain in left knee: Secondary | ICD-10-CM

## 2017-10-08 DIAGNOSIS — M7542 Impingement syndrome of left shoulder: Secondary | ICD-10-CM | POA: Insufficient documentation

## 2017-10-08 DIAGNOSIS — G8929 Other chronic pain: Secondary | ICD-10-CM | POA: Insufficient documentation

## 2017-10-09 ENCOUNTER — Other Ambulatory Visit
Admission: RE | Admit: 2017-10-09 | Discharge: 2017-10-09 | Disposition: A | Discharge status: Home / Self Care | Payer: BC Managed Care – PPO | Source: Ambulatory Visit | Attending: Urology | Admitting: Urology

## 2017-10-09 ENCOUNTER — Ambulatory Visit (INDEPENDENT_AMBULATORY_CARE_PROVIDER_SITE_OTHER): Payer: BC Managed Care – PPO | Admitting: Urology

## 2017-10-09 DIAGNOSIS — E291 Testicular hypofunction: Secondary | ICD-10-CM | POA: Diagnosis not present

## 2017-10-09 MED ORDER — TESTOSTERONE CYPIONATE 200 MG/ML IM SOLN
200.0000 mg | Freq: Once | INTRAMUSCULAR | Status: AC
  Administered 2017-10-09: 200 mg via INTRAMUSCULAR

## 2017-10-09 NOTE — Progress Notes (Signed)
Testosterone IM Injection  Due to Hypogonadism patient is present today for a Testosterone Injection.  Medication: Testosterone Cypionate Dose: 0.75 ml Location: Left upper outer buttocks Lot: [id number] Exp:03/2018  Patient tolerated well, no complications  Preformed by: Dallas Schimkeamona Williams CMA  Follow up: 1 week  Patient had blood drawn this am for testosterone. He said he was supposed to get lab draw after 4th injection. Today would have been the fifth in order. Patient also wanted [name] to know that he did have a steroid injection in his shoulder yesterday in case that complicates the blood draw this am. [name] informed.

## 2017-10-10 LAB — TESTOSTERONE: Testosterone: 776 ng/dL (ref 264–916)

## 2017-10-12 ENCOUNTER — Telehealth: Payer: Self-pay

## 2017-10-12 NOTE — Telephone Encounter (Signed)
Called pt and gave results. Pt verbalized understanding.

## 2017-10-12 NOTE — Telephone Encounter (Signed)
-----   Message from Harle BattiestShannon A McGowan, PA-C sent at 10/12/2017  8:36 AM EDT ----- Please let Mr. Abigail Miyamotohacker know that his testosterone is normal.

## 2017-10-16 ENCOUNTER — Ambulatory Visit (INDEPENDENT_AMBULATORY_CARE_PROVIDER_SITE_OTHER): Payer: BC Managed Care – PPO | Admitting: Urology

## 2017-10-16 DIAGNOSIS — E291 Testicular hypofunction: Secondary | ICD-10-CM

## 2017-10-16 MED ORDER — TESTOSTERONE CYPIONATE 200 MG/ML IM SOLN
200.0000 mg | Freq: Once | INTRAMUSCULAR | Status: AC
  Administered 2017-10-16: 200 mg via INTRAMUSCULAR

## 2017-10-16 NOTE — Progress Notes (Signed)
Testosterone IM Injection  Due to Hypogonadism patient is present today for a Testosterone Injection.  Medication: Testosterone Cypionate Dose: 0.6075mL Location: right upper outer buttocks Lot: [id number] Exp:03/2018  Patient tolerated well, no complications noted.  Preformed by: Rupert Stackshelsea Hugh Garrow, LPN   Follow up: 1 week

## 2017-10-23 ENCOUNTER — Ambulatory Visit (INDEPENDENT_AMBULATORY_CARE_PROVIDER_SITE_OTHER): Payer: BC Managed Care – PPO | Admitting: Urology

## 2017-10-23 DIAGNOSIS — E291 Testicular hypofunction: Secondary | ICD-10-CM

## 2017-10-23 MED ORDER — TESTOSTERONE CYPIONATE 200 MG/ML IM SOLN
150.0000 mg | Freq: Once | INTRAMUSCULAR | Status: AC
  Administered 2017-10-23: 150 mg via INTRAMUSCULAR

## 2017-10-23 NOTE — Progress Notes (Signed)
Testosterone IM Injection  Due to Hypogonadism patient is present today for a Testosterone Injection.  Medication: Testosterone Cypionate Dose: 0.6675mL Location: Left upper outer buttocks Lot: [id number] Exp:03/2018  Patient tolerated well, no complications noted.  Preformed by: Eligha BridegroomSarah Sianni Cloninger, CMA   Follow up: 1 week

## 2017-10-29 NOTE — Progress Notes (Signed)
10/30/2017 9:15 AM   Jeffrey Dexteraniel Lundberg Jr. Jan 04, 1966 [id number]  Referring provider: Orene DesanctisBehling, Karen, MD [address] [place] [address] [place], [place] [address]  Chief Complaint  Patient presents with  . Follow-up  . Benign Prostatic Hypertrophy  . Hypogonadism    HPI: Patient is a 51 year old Caucasian male with testosterone deficiency, erectile dysfunction and BPH with LUTS who presents today for a 6 month follow-up.  Testosterone deficiency Patient is complaining of a lack of energy, decrease in strength and endurance and falling asleep after dinner.  This is indicated by his responses to the ADAM questionnaire.  He is still having spontaneous erections at night.   He does not have sleep apnea.   He has type 2 diabetes mellitus.  His most recent testosterone level was 776 ng/dL on 96/29/528410/19/2018.  His HCT were normal at that time.  He is currently managing his hypogonadism with testosterone cypionate 200 mg/cc, 0.75 cc q weekly.    Androgen Deficiency in the Aging Male    Row Name 10/30/17 0900         Androgen Deficiency in the Aging Male   Do you have a decrease in libido (sex drive)  No     Do you have lack of energy  Yes     Do you have a decrease in strength and/or endurance  Yes     Have you lost height  No     Have you noticed a decreased "enjoyment of life"  No     Are you sad and/or grumpy  No     Are your erections less strong  No     Have you noticed a recent deterioration in your ability to play sports  No     Are you falling asleep after dinner  Yes     Has there been a recent deterioration in your work performance  No        Erectile dysfunction His SHIM score is 24, which is no ED.   His previous SHIM score was 24.   His libido is preserved   His risk factors for ED are age, BPH, hypogonadism, DM, HTN and HLD.  He denies any painful erections or curvatures with his erections.   He is still spontaneous erections.   SHIM    Row Name 10/30/17 0904         SHIM: Over the last  6 months:   How do you rate your confidence that you could get and keep an erection?  High     When you had erections with sexual stimulation, how often were your erections hard enough for penetration (entering your partner)?  Almost Always or Always     During sexual intercourse, how often were you able to maintain your erection after you had penetrated (entered) your partner?  Almost Always or Always     During sexual intercourse, how difficult was it to maintain your erection to completion of intercourse?  Not Difficult     When you attempted sexual intercourse, how often was it satisfactory for you?  Almost Always or Always       SHIM Total Score   SHIM  24        Score: 1-7 Severe ED 8-11 Moderate ED 12-16 Mild-Moderate ED 17-21 Mild ED 22-25 No ED   BPH WITH LUTS His IPSS score today is 4, which is mild lower urinary tract symptomatology. He is pleased with his quality life due to his urinary symptoms.  His  previous IPSS score was 4/1.  His major complaints today are urgency and nocturia.  He has had these symptoms for few years.  He denies any dysuria, hematuria or suprapubic pain.   He also denies any recent fevers, chills, nausea or vomiting. He does not have a family history of PCa.  IPSS    Row Name 10/30/17 0900         International Prostate Symptom Score   How often have you had the sensation of not emptying your bladder?  Less than 1 in 5     How often have you had to urinate less than every two hours?  Less than 1 in 5 times     How often have you found you stopped and started again several times when you urinated?  Not at All     How often have you found it difficult to postpone urination?  Less than 1 in 5 times     How often have you had a weak urinary stream?  Not at All     How often have you had to strain to start urination?  Not at All     How many times did you typically get up at night to urinate?  1 Time     Total IPSS Score  4       Quality of Life  due to urinary symptoms   If you were to spend the rest of your life with your urinary condition just the way it is now how would you feel about that?  Pleased        Score:  1-7 Mild 8-19 Moderate 20-35 Severe    PMH: Past Medical History:  Diagnosis Date  . Allergic rhinitis   . BPH with obstruction/lower urinary tract symptoms   . Chronic headaches   . Diabetes mellitus without complication (HCC)   . Diverticulosis   . Dyslipidemia   . Erectile dysfunction   . Fatty liver   . GERD (gastroesophageal reflux disease)   . Hypogonadism in male   . Personal history of multiple concussions   . Steatohepatitis, non-alcoholic     Surgical History: Past Surgical History:  Procedure Laterality Date  . LIVER BIOPSY  06/10/2009  . MYELOGRAM    . NASAL SINUS SURGERY      Home Medications:  Allergies as of 10/30/2017      Reactions   Codeine    Fenofibrate Other (See Comments)   Fish Oil    Other reaction(s): Other (See Comments) Dyspepsia, foul taste, loose stools - tolerates Lovaza   Niacin And Related    Prednisone    Headache   Tricor [fenofibrate]       Medication List        Accurate as of 10/30/17  9:15 AM. Always use your most recent med list.          aspirin 81 MG tablet Take 81 mg by mouth daily.   atorvastatin 10 MG tablet Commonly known as:  LIPITOR Take 10 mg by mouth daily.   cholecalciferol 400 units Tabs tablet Commonly known as:  VITAMIN D Take 1,000 Units by mouth daily.   doxycycline 100 MG capsule Commonly known as:  VIBRAMYCIN   fenofibrate 160 MG tablet Take 160 mg by mouth daily.   lisinopril 5 MG tablet Commonly known as:  PRINIVIL,ZESTRIL Take 5 mg by mouth daily.   metFORMIN 850 MG tablet Commonly known as:  GLUCOPHAGE Take 850 mg by mouth 2 (two) times  daily with a meal.   nystatin-triamcinolone ointment Commonly known as:  MYCOLOG Apply 1 application topically 2 (two) times daily.   omega-3 acid ethyl esters 1 g  capsule Commonly known as:  LOVAZA Take 1 g by mouth 2 (two) times daily.   propranolol 10 MG tablet Commonly known as:  INDERAL Take by mouth.   SUMAtriptan 25 MG tablet Commonly known as:  IMITREX Take by mouth.   testosterone cypionate 200 MG/ML injection Commonly known as:  DEPOTESTOSTERONE CYPIONATE Inject 1 mL (200 mg total) into the muscle every 14 (fourteen) days.   VASCEPA 1 g Caps Generic drug:  Icosapent Ethyl TK 2 CS PO BID   vitamin E 400 UNIT capsule Take 800 Units by mouth daily.       Allergies:  Allergies  Allergen Reactions  . Codeine   . Fenofibrate Other (See Comments)  . Fish Oil     Other reaction(s): Other (See Comments) Dyspepsia, foul taste, loose stools - tolerates Lovaza  . Niacin And Related   . Prednisone     Headache  . Tricor [Fenofibrate]     Family History: Family History  Problem Relation Age of Onset  . Hypertension Mother   . Alcohol abuse Mother   . Diabetes Mother   . Stroke Mother   . Transient ischemic attack Father   . Hyperlipidemia Father   . Melanoma Father   . Heart attack Father   . Kidney cancer Neg Hx   . Prostate cancer Neg Hx   . Bladder Cancer Neg Hx     Social History:  reports that  has never smoked. he has never used smokeless tobacco. He reports that he drinks alcohol. He reports that he does not use drugs.  ROS: UROLOGY Frequent Urination?: No Hard to postpone urination?: No Burning/pain with urination?: No Get up at night to urinate?: Yes Leakage of urine?: No Urine stream starts and stops?: No Trouble starting stream?: No Do you have to strain to urinate?: No Blood in urine?: No Urinary tract infection?: No Sexually transmitted disease?: No Injury to kidneys or bladder?: No Painful intercourse?: No Weak stream?: No Erection problems?: No Penile pain?: No  Gastrointestinal Nausea?: No Vomiting?: No Indigestion/heartburn?: No Diarrhea?: No Constipation?:  No  Constitutional Fever: No Night sweats?: No Weight loss?: No Fatigue?: No  Skin Skin rash/lesions?: No Itching?: No  Eyes Blurred vision?: No Double vision?: No  Ears/Nose/Throat Sore throat?: No Sinus problems?: No  Hematologic/Lymphatic Swollen glands?: No Easy bruising?: No  Cardiovascular Leg swelling?: No Chest pain?: No  Respiratory Cough?: No Shortness of breath?: No  Endocrine Excessive thirst?: No  Musculoskeletal Back pain?: No Joint pain?: No  Neurological Headaches?: No Dizziness?: No  Psychologic Depression?: No Anxiety?: No  Physical Exam: There were no vitals taken for this visit.  Constitutional: Well nourished. Alert and oriented, No acute distress. HEENT: [place] AT, moist mucus membranes. Trachea midline, no masses. Cardiovascular: No clubbing, cyanosis, or edema. Respiratory: Normal respiratory effort, no increased work of breathing. GI: Abdomen is soft, non tender, non distended, no abdominal masses. Liver and spleen not palpable.  No hernias appreciated.  Stool sample for occult testing is not indicated.   GU: No CVA tenderness.  No bladder fullness or masses.  Patient with circumcised phallus.  Urethral meatus is patent.  No penile discharge. No penile lesions or rashes. Scrotum without lesions, cysts, rashes and/or edema.  Testicles are located scrotally bilaterally. No masses are appreciated in the testicles. Left and right  epididymis are normal. Rectal: Patient with  normal sphincter tone. Anus and perineum without scarring or rashes. No rectal masses are appreciated. Prostate is approximately 50 grams, no nodules are appreciated. Seminal vesicles are normal. Skin: No rashes, bruises or suspicious lesions. Lymph: No cervical or inguinal adenopathy. Neurologic: Grossly intact, no focal deficits, moving all 4 extremities. Psychiatric: Normal mood and affect.  Laboratory Data: PSA History  0.22 ng/mL on 04/24/2017  0.3   Ng/mL on  10/13/2016   Lab Results  Component Value Date   WBC 5.2 04/24/2017   HGB 14.2 08/07/2017   HCT 43.0 08/07/2017   MCV 79.3 (L) 04/24/2017   PLT 159 04/24/2017    Lab Results  Component Value Date   TESTOSTERONE 776 10/09/2017  I have reviewed the labs.  Assessment & Plan:    1. Testosterone deficiency   - most recent testosterone level is 776 ng/dL on 16/10//960410/19//2018 (goal [phone] ng/dL)  - continue testosterone cypionate 200 mg/cc, 0.75 cc q week - patient will continue self injection  - HCT and HBG drawn today  - RTC in 3 months for HCT, HBG and testosterone level only  - RTC in 6 months for HCT, HBG, testosterone, PSA, ADAM and exam  2. BPH with LUTS  - IPSS score is 4/0, it is stable  - Continue conservative management, avoiding bladder irritants and timed voiding's  - PSA drawn today   - RTC in 6 months for IPSS, PSA and exam, as testosterone therapy can cause prostate enlargement and worsen LUTS  3. Erectile dysfunction:     -SHIM score is 24  -RTC in 6 months for SHIM score and exam, as testosterone therapy can affect erections   Return in about 6 months (around 04/29/2018) for PSA, LFT's, HCT, HBG, testosterone (one week after the injection), ADAM, IPSS, SHIM and exam.  These notes generated with voice recognition software. I apologize for typographical errors.  Michiel CowboySHANNON Keller Bounds, PA-C  [facility] [address], [address] [place], [place] [address] [phone]

## 2017-10-30 ENCOUNTER — Ambulatory Visit (INDEPENDENT_AMBULATORY_CARE_PROVIDER_SITE_OTHER): Payer: BC Managed Care – PPO | Admitting: Urology

## 2017-10-30 ENCOUNTER — Other Ambulatory Visit
Admission: RE | Admit: 2017-10-30 | Discharge: 2017-10-30 | Disposition: A | Discharge status: Home / Self Care | Payer: BC Managed Care – PPO | Source: Ambulatory Visit | Attending: Urology | Admitting: Urology

## 2017-10-30 DIAGNOSIS — N529 Male erectile dysfunction, unspecified: Secondary | ICD-10-CM | POA: Diagnosis not present

## 2017-10-30 DIAGNOSIS — E291 Testicular hypofunction: Secondary | ICD-10-CM | POA: Diagnosis not present

## 2017-10-30 DIAGNOSIS — N138 Other obstructive and reflux uropathy: Secondary | ICD-10-CM | POA: Diagnosis not present

## 2017-10-30 DIAGNOSIS — E349 Endocrine disorder, unspecified: Secondary | ICD-10-CM | POA: Diagnosis not present

## 2017-10-30 DIAGNOSIS — N401 Enlarged prostate with lower urinary tract symptoms: Secondary | ICD-10-CM | POA: Diagnosis not present

## 2017-10-30 MED ORDER — TESTOSTERONE CYPIONATE 200 MG/ML IM SOLN
200.0000 mg | Freq: Once | INTRAMUSCULAR | Status: AC
  Administered 2017-10-30: 200 mg via INTRAMUSCULAR

## 2017-10-30 MED ORDER — TESTOSTERONE CYPIONATE 200 MG/ML IM SOLN: INTRAMUSCULAR | 0 refills | Status: DC

## 2017-10-30 NOTE — Progress Notes (Signed)
Testosterone IM Injection  Due to Hypogonadism patient is present today for a Testosterone Injection.  Medication: Testosterone Cypionate Dose: 0.1875mL Location: left upper outer buttocks Lot: [id number] Exp:03/2018  Patient tolerated well,   Preformed by: Rupert Stackshelsea Watkins, LPN

## 2017-10-31 LAB — TESTOSTERONE: TESTOSTERONE: 725 ng/dL (ref 264–916)

## 2017-11-03 ENCOUNTER — Telehealth: Payer: Self-pay

## 2017-11-03 NOTE — Telephone Encounter (Signed)
Spoke with pt in reference to testosterone results. Pt voiced understanding. Pt inquired about PSA. Do you want test added?

## 2017-11-03 NOTE — Telephone Encounter (Signed)
-----   Message from Harle BattiestShannon A McGowan, PA-C sent at 11/01/2017  6:39 PM EST ----- Please let Mr Abigail Miyamotohacker know that his testosterone is normal.

## 2017-11-03 NOTE — Telephone Encounter (Signed)
Yes, please.

## 2017-11-04 NOTE — Telephone Encounter (Signed)
Sp[facility] with [facility] who stated that she will call [facility] and try to add PSA. Lynden AngCathy stated that she will call back if not able to add lab.

## 2017-11-05 ENCOUNTER — Telehealth: Payer: Self-pay

## 2017-11-05 LAB — MISC LABCORP TEST (SEND OUT): LABCORP TEST CODE: 10322

## 2017-11-05 NOTE — Telephone Encounter (Signed)
Spoke with pt in reference to PSA results. Pt voiced understanding.  

## 2017-11-05 NOTE — Telephone Encounter (Signed)
-----   Message from Harle BattiestShannon A McGowan, PA-C sent at 11/05/2017  1:11 PM EST ----- Please let Mr. Abigail Miyamotohacker know that his PSA was normal.

## 2017-11-11 ENCOUNTER — Encounter: Payer: Self-pay | Admitting: Urology

## 2017-11-13 ENCOUNTER — Other Ambulatory Visit: Payer: Self-pay

## 2017-11-13 ENCOUNTER — Ambulatory Visit
Admission: EM | Admit: 2017-11-13 | Discharge: 2017-11-13 | Disposition: A | Discharge status: Home / Self Care | Payer: BC Managed Care – PPO | Attending: Family Medicine | Admitting: Family Medicine

## 2017-11-13 DIAGNOSIS — B029 Zoster without complications: Secondary | ICD-10-CM

## 2017-11-13 MED ORDER — VALACYCLOVIR HCL 1 G PO TABS: 1000.0000 mg | ORAL_TABLET | Freq: Three times a day (TID) | ORAL | 0 refills | Status: DC

## 2017-11-13 MED ORDER — CYCLOBENZAPRINE HCL 10 MG PO TABS: 10.0000 mg | ORAL_TABLET | Freq: Every day | ORAL | 0 refills | Status: DC

## 2017-11-13 NOTE — ED Triage Notes (Signed)
Patient complains of left hip pain that radiates down his left. Patient states that pain is burning. Patient reports that he has been unable to stand something touching his leg. Patient states that he noticed a rash on his knee that he is concerned could be shingles. Patient states that he noticed the rash yesterday. Patient states that overall he feels bad.

## 2017-11-13 NOTE — ED Provider Notes (Signed)
[facility]    CSN: [id number] Arrival date & time: 11/13/17  1349     History   Chief Complaint Chief Complaint  Patient presents with  . Hip Pain    left    HPI Jeffrey DexterDaniel Garman Jr. is a 51 y.o. male.   51 yo male with a 1 week h/o left low back/buttock burning pain radiating down the leg and one day h/o rash on his lower leg. Also complains of malaise. Denies any fevers, chills, trauma, falls, injuries.    The history is provided by the patient.    Past Medical History:  Diagnosis Date  . Allergic rhinitis   . BPH with obstruction/lower urinary tract symptoms   . Chronic headaches   . Diabetes mellitus without complication (HCC)   . Diverticulosis   . Dyslipidemia   . Erectile dysfunction   . Fatty liver   . GERD (gastroesophageal reflux disease)   . Hypogonadism in male   . Personal history of multiple concussions   . Steatohepatitis, non-alcoholic     Patient Active Problem List   Diagnosis Date Noted  . Erectile dysfunction of non-organic origin 04/14/2016  . Hypogonadism in male 10/17/2015  . BPH with obstruction/lower urinary tract symptoms 10/17/2015  . Balanitis 10/17/2015  . Polypharmacy 04/18/2015  . Muscle ache 10/13/2014  . Avitaminosis D 03/29/2014  . Abscess or cellulitis of leg 11/08/2012  . Fatty liver disease, nonalcoholic 01/07/2012  . Acid reflux 01/07/2012  . 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 Diabetes mellitus, type 2 (HCC) 01/06/2012  . Hypertriglyceridemia 01/06/2012    Past Surgical History:  Procedure Laterality Date  . LIVER BIOPSY  06/10/2009  . MYELOGRAM    . NASAL SINUS SURGERY         Home Medications    Prior to Admission medications   Medication Sig Start Date End Date Taking? Authorizing Provider  aspirin 81 MG tablet Take 81 mg by mouth daily.   Yes [provider]  atorvastatin (LIPITOR) 10 MG tablet Take 10 mg by mouth daily.   Yes [provider]  cholecalciferol (VITAMIN  D) 400 UNITS TABS tablet Take 1,000 Units by mouth daily.   Yes [provider]  fenofibrate 160 MG tablet Take 160 mg by mouth daily.   Yes [provider]  lisinopril (PRINIVIL,ZESTRIL) 5 MG tablet Take 5 mg by mouth daily.   Yes [provider]  metFORMIN (GLUCOPHAGE) 850 MG tablet Take 850 mg by mouth 2 (two) times daily with a meal.   Yes [provider]  nystatin-triamcinolone ointment (MYCOLOG) Apply 1 application topically 2 (two) times daily. 10/16/15  Yes McGowan, [name] A, PA-C  omega-3 acid ethyl esters (LOVAZA) 1 G capsule Take 1 g by mouth 2 (two) times daily.   Yes [provider]  propranolol (INDERAL) 10 MG tablet Take by mouth. 09/23/17 09/23/18 Yes [provider]  SUMAtriptan (IMITREX) 25 MG tablet Take by mouth. 09/23/17  Yes [provider]  testosterone cypionate (DEPOTESTOSTERONE CYPIONATE) 200 MG/ML injection Inject 0.4175mL IM every week 10/30/17  Yes McGowan, Shannon A, PA-C  VASCEPA 1 g CAPS TK 2 CS PO BID 09/26/17  Yes [provider]  vitamin E 400 UNIT capsule Take 800 Units by mouth daily.   Yes [provider]  cyclobenzaprine (FLEXERIL) 10 MG tablet Take 1 tablet (10 mg total) by mouth at bedtime. 11/13/17   Payton Mccallumonty, Ura Hausen, MD  doxycycline (VIBRAMYCIN) 100 MG capsule  03/27/16   [provider]  valACYclovir (VALTREX) 1000 MG tablet Take 1 tablet (1,000 mg total) by mouth 3 (three) times daily. 11/13/17   Payton Mccallumonty, Tujuana Kilmartin, MD    Family History Family History  Problem Relation Age of Onset  . Hypertension Mother   . Alcohol abuse Mother   . Diabetes Mother   . Stroke Mother   . Transient ischemic attack Father   . Hyperlipidemia Father   . Melanoma Father   . Heart attack Father   . Kidney cancer Neg Hx   . Prostate cancer Neg Hx   . Bladder Cancer Neg Hx     Social History Social History   Tobacco Use  . Smoking status: Never Smoker  . Smokeless tobacco: Never Used    Substance Use Topics  . Alcohol use: Yes    Alcohol/week: 0.0 oz    Comment: occasional  . Drug use: No     Allergies   Codeine; Fenofibrate; Fish oil; Niacin and related; Prednisone; and Tricor [fenofibrate]   Review of Systems Review of Systems   Physical Exam Triage Vital Signs ED Triage Vitals  Enc Vitals Group     BP 11/13/17 1424 115/74     Pulse Rate 11/13/17 1424 79     Resp 11/13/17 1424 17     Temp 11/13/17 1424 98 F (36.7 C)     Temp Source 11/13/17 1424 Oral     SpO2 11/13/17 1424 96 %     Weight 11/13/17 1421 195 lb (88.5 kg)     Height 11/13/17 1421 5' 11.5" (1.816 m)     Head Circumference --      Peak Flow --      Pain Score 11/13/17 1421 6     Pain Loc --      Pain Edu? --      Excl. in GC? --    No data found.  Updated Vital Signs BP 115/74 (BP Location: Left Arm)   Pulse 79   Temp 98 F (36.7 C) (Oral)   Resp 17   Ht 5' 11.5" (1.816 m)   Wt 195 lb (88.5 kg)   SpO2 96%   BMI 26.82 kg/m   Visual Acuity Right Eye Distance:   Left Eye Distance:   Bilateral Distance:    Right Eye Near:   Left Eye Near:    Bilateral Near:     Physical Exam  Constitutional: He appears well-developed and well-nourished. No distress.  Musculoskeletal:       Lumbar back: Normal.  Skin: Rash noted. He is not diaphoretic.  Vesicular erythematous rash on left lower leg below the knee  Nursing note and vitals reviewed.    UC Treatments / Results  Labs (all labs ordered are listed, but only abnormal results are displayed) Labs Reviewed - No data to display  EKG  EKG Interpretation None       Radiology No results found.  Procedures Procedures (including critical care time)  Medications Ordered in UC Medications - No data to display   Initial Impression / Assessment and Plan / UC Course  I have reviewed the triage vital signs and the nursing notes.  Pertinent labs & imaging results that were available during my care of the patient were  reviewed by me and considered in my medical decision making (see chart for details).       Final Clinical Impressions(s) / UC Diagnoses   Final diagnoses:  Herpes zoster without complication    ED Discharge Orders  Ordered    valACYclovir (VALTREX) 1000 MG tablet  3 times daily     11/13/17 1508    cyclobenzaprine (FLEXERIL) 10 MG tablet  Daily at bedtime     11/13/17 1509     1. diagnosis reviewed with patient 2. rx as per orders above; reviewed possible side effects, interactions, risks and benefits  3. Recommend supportive treatment with otc analgesics prn 4. Follow-up prn if symptoms worsen or don't improve  Controlled Substance Prescriptions [place] Controlled Substance Registry consulted? Not Applicable   Payton Mccallum, MD 11/13/17 [phone]

## 2018-02-05 ENCOUNTER — Other Ambulatory Visit
Admission: RE | Admit: 2018-02-05 | Discharge: 2018-02-05 | Disposition: A | Discharge status: Home / Self Care | Payer: BC Managed Care – PPO | Source: Ambulatory Visit | Attending: Urology | Admitting: Urology

## 2018-02-05 ENCOUNTER — Other Ambulatory Visit: Payer: Self-pay

## 2018-02-05 DIAGNOSIS — E291 Testicular hypofunction: Secondary | ICD-10-CM

## 2018-02-05 LAB — PSA: Prostatic Specific Antigen: 0.35 ng/mL (ref 0.00–4.00)

## 2018-02-05 LAB — HEMOGLOBIN AND HEMATOCRIT, BLOOD
HEMATOCRIT: 46.9 % (ref 40.0–52.0)
Hemoglobin: 15.3 g/dL (ref 13.0–18.0)

## 2018-02-08 ENCOUNTER — Telehealth: Payer: Self-pay

## 2018-02-08 LAB — TESTOSTERONE: TESTOSTERONE: 885 ng/dL (ref 264–916)

## 2018-02-08 NOTE — Telephone Encounter (Signed)
-----   Message from Harle BattiestShannon A McGowan, PA-C sent at 02/07/2018  8:33 PM EST ----- Please let Mr. Jeffrey Miyamotohacker know that his labs were normal.

## 2018-02-08 NOTE — Telephone Encounter (Signed)
Letter sent.

## 2018-02-11 ENCOUNTER — Telehealth: Payer: Self-pay

## 2018-02-11 DIAGNOSIS — E291 Testicular hypofunction: Secondary | ICD-10-CM

## 2018-02-11 MED ORDER — TESTOSTERONE CYPIONATE 200 MG/ML IM SOLN: INTRAMUSCULAR | 0 refills | Status: DC

## 2018-02-11 NOTE — Telephone Encounter (Signed)
It is fine to send a refill for testosterone cypionate 0.75 cc q weekly.

## 2018-02-11 NOTE — Telephone Encounter (Signed)
Pt called stated that he is needing more testosterone meds. Pt requested that script say 0.1875mL of the script. Please advise.

## 2018-02-11 NOTE — Telephone Encounter (Signed)
Medication refilled

## 2018-04-30 ENCOUNTER — Ambulatory Visit: Payer: BC Managed Care – PPO | Admitting: Urology

## 2018-04-30 ENCOUNTER — Other Ambulatory Visit
Admission: RE | Admit: 2018-04-30 | Discharge: 2018-04-30 | Disposition: A | Discharge status: Home / Self Care | Payer: BC Managed Care – PPO | Source: Ambulatory Visit | Attending: Urology | Admitting: Urology

## 2018-04-30 DIAGNOSIS — E291 Testicular hypofunction: Secondary | ICD-10-CM | POA: Insufficient documentation

## 2018-04-30 LAB — HEMOGLOBIN AND HEMATOCRIT, BLOOD
HEMATOCRIT: 45.5 % (ref 40.0–52.0)
HEMOGLOBIN: 14.6 g/dL (ref 13.0–18.0)

## 2018-04-30 LAB — HEPATIC FUNCTION PANEL
ALBUMIN: 4.2 g/dL (ref 3.5–5.0)
ALT: 19 U/L (ref 17–63)
AST: 22 U/L (ref 15–41)
Alkaline Phosphatase: 49 U/L (ref 38–126)
Total Bilirubin: 0.5 mg/dL (ref 0.3–1.2)
Total Protein: 7 g/dL (ref 6.5–8.1)

## 2018-05-01 LAB — TESTOSTERONE: Testosterone: 947 ng/dL — ABNORMAL HIGH (ref 264–916)

## 2018-05-03 ENCOUNTER — Telehealth: Payer: Self-pay

## 2018-05-03 DIAGNOSIS — E291 Testicular hypofunction: Secondary | ICD-10-CM

## 2018-05-03 NOTE — Telephone Encounter (Signed)
-----   Message from Harle Battiest, PA-C sent at 05/03/2018  8:28 AM EDT ----- Please let Jeffrey Cook know that his testosterone is elevated.  He needs to decrease his dose to 0.5 cc weekly.   He will also need an office visit in June.  We can recheck his testosterone level, HCT, Hbg and PSA prior to that visit.

## 2018-05-03 NOTE — Telephone Encounter (Signed)
Patient notified , scheduled for June and future lab orders placed

## 2018-05-04 DIAGNOSIS — R9431 Abnormal electrocardiogram [ECG] [EKG]: Secondary | ICD-10-CM | POA: Insufficient documentation

## 2018-05-26 ENCOUNTER — Telehealth: Payer: Self-pay | Admitting: Urology

## 2018-05-26 ENCOUNTER — Other Ambulatory Visit
Admission: RE | Admit: 2018-05-26 | Discharge: 2018-05-26 | Disposition: A | Discharge status: Home / Self Care | Payer: BC Managed Care – PPO | Source: Ambulatory Visit | Attending: Urology | Admitting: Urology

## 2018-05-26 DIAGNOSIS — E291 Testicular hypofunction: Secondary | ICD-10-CM | POA: Diagnosis present

## 2018-05-26 LAB — HEMOGLOBIN AND HEMATOCRIT, BLOOD
HCT: 43.1 % (ref 40.0–52.0)
Hemoglobin: 14 g/dL (ref 13.0–18.0)

## 2018-05-26 LAB — PSA: PROSTATIC SPECIFIC ANTIGEN: 0.31 ng/mL (ref 0.00–4.00)

## 2018-05-26 NOTE — Telephone Encounter (Signed)
Patient will have script refilled at apt on Friday, patient notified

## 2018-05-26 NOTE — Telephone Encounter (Signed)
Pt called about PA for testosterone.  He has an appt w/Shannon in [place] this Friday.

## 2018-05-27 LAB — TESTOSTERONE: Testosterone: 637 ng/dL (ref 264–916)

## 2018-05-27 NOTE — Progress Notes (Signed)
05/28/2018 10:58 AM   Jeffrey Cook. 14-Jan-1966 [id number]  Referring provider: Orene Desanctis, MD [address] [place], [place] [address]  No chief complaint on file.   HPI: Patient is a 52 year old Caucasian male with testosterone deficiency, erectile dysfunction and BPH with LUTS who presents today for a 6 month follow-up.  Testosterone deficiency He is still having spontaneous erections at night.   He does not have sleep apnea.   He has type 2 diabetes mellitus.  His most recent testosterone level was 637 ng/dL on 62/13/0865.  His HCT is 43.1% and Hbg is 14.0.  He is currently managing his testosterone deficiency with testosterone cypionate 200 mg/cc, 0.5 cc q weekly.      Erectile dysfunction His SHIM score is 22, which is no ED.   His previous SHIM score was 24.   His libido is preserved   His risk factors for ED are age, BPH, testosterone deficiency, DM, HTN and HLD.  He denies any painful erections or curvatures with his erections.   He is still spontaneous erections.   SHIM    Row Name 05/28/18 1005         SHIM: Over the last 6 months:   How do you rate your confidence that you could get and keep an erection?  High     When you had erections with sexual stimulation, how often were your erections hard enough for penetration (entering your partner)?  Almost Always or Always     During sexual intercourse, how often were you able to maintain your erection after you had penetrated (entered) your partner?  Most Times (much more than half the time)     During sexual intercourse, how difficult was it to maintain your erection to completion of intercourse?  Slightly Difficult     When you attempted sexual intercourse, how often was it satisfactory for you?  Almost Always or Always       SHIM Total Score   SHIM  22        Score: 1-7 Severe ED 8-11 Moderate ED 12-16 Mild-Moderate ED 17-21 Mild ED 22-25 No ED   BPH WITH LUTS His IPSS score today is 2, which is mild  lower urinary tract symptomatology. He is delighted with his quality life due to his urinary symptoms.  His previous IPSS score was 4/1.  His major complaints today is nocturia.  He has had these symptoms for few years.  He denies any dysuria, hematuria or suprapubic pain.   He also denies any recent fevers, chills, nausea or vomiting.  He does not have a family history of PCa. IPSS    Row Name 05/28/18 0900         International Prostate Symptom Score   How often have you had the sensation of not emptying your bladder?  Not at All     How often have you had to urinate less than every two hours?  Not at All     How often have you found you stopped and started again several times when you urinated?  Not at All     How often have you found it difficult to postpone urination?  Less than 1 in 5 times     How often have you had a weak urinary stream?  Not at All     How often have you had to strain to start urination?  Not at All     How many times did you typically get  up at night to urinate?  1 Time     Total IPSS Score  2       Quality of Life due to urinary symptoms   If you were to spend the rest of your life with your urinary condition just the way it is now how would you feel about that?  Delighted        Score:  1-7 Mild 8-19 Moderate 20-35 Severe    PMH: Past Medical History:  Diagnosis Date  . Allergic rhinitis   . BPH with obstruction/lower urinary tract symptoms   . Chronic headaches   . Diabetes mellitus without complication (HCC)   . Diverticulosis   . Dyslipidemia   . Erectile dysfunction   . Fatty liver   . GERD (gastroesophageal reflux disease)   . Hypogonadism in male   . Personal history of multiple concussions   . Steatohepatitis, non-alcoholic     Surgical History: Past Surgical History:  Procedure Laterality Date  . LIVER BIOPSY  06/10/2009  . MYELOGRAM    . NASAL SINUS SURGERY      Home Medications:  Allergies as of 05/28/2018      Reactions    Codeine    Fenofibrate Other (See Comments)   Fish Oil    Other reaction(s): Other (See Comments) Dyspepsia, foul taste, loose stools - tolerates Lovaza   Niacin And Related    Prednisone    Headache   Tricor [fenofibrate]       Medication List        Accurate as of 05/28/18 10:58 AM. Always use your most recent med list.          aspirin 81 MG tablet Take 81 mg by mouth daily.   atorvastatin 10 MG tablet Commonly known as:  LIPITOR Take 10 mg by mouth daily.   cholecalciferol 400 units Tabs tablet Commonly known as:  VITAMIN D Take 1,000 Units by mouth daily.   cyclobenzaprine 10 MG tablet Commonly known as:  FLEXERIL Take 1 tablet (10 mg total) by mouth at bedtime.   fenofibrate 160 MG tablet Take 160 mg by mouth daily.   lisinopril 5 MG tablet Commonly known as:  PRINIVIL,ZESTRIL Take 5 mg by mouth daily.   metFORMIN 850 MG tablet Commonly known as:  GLUCOPHAGE Take 850 mg by mouth 2 (two) times daily with a meal.   propranolol 10 MG tablet Commonly known as:  INDERAL Take by mouth.   SUMAtriptan 25 MG tablet Commonly known as:  IMITREX Take by mouth.   testosterone cypionate 200 MG/ML injection Commonly known as:  DEPOTESTOSTERONE CYPIONATE Inject 0.7375mL IM every week   valACYclovir 1000 MG tablet Commonly known as:  VALTREX Take 1 tablet (1,000 mg total) by mouth 3 (three) times daily.   VASCEPA 1 g Caps Generic drug:  Icosapent Ethyl TK 2 CS PO BID   vitamin B-12 1000 MCG tablet Commonly known as:  CYANOCOBALAMIN Take by mouth.   vitamin E 400 UNIT capsule Take 800 Units by mouth daily.       Allergies:  Allergies  Allergen Reactions  . Codeine   . Fenofibrate Other (See Comments)  . Fish Oil     Other reaction(s): Other (See Comments) Dyspepsia, foul taste, loose stools - tolerates Lovaza  . Niacin And Related   . Prednisone     Headache  . Tricor [Fenofibrate]     Family History: Family History  Problem Relation Age of  Onset  . Hypertension Mother   .  Alcohol abuse Mother   . Diabetes Mother   . Stroke Mother   . Transient ischemic attack Father   . Hyperlipidemia Father   . Melanoma Father   . Heart attack Father   . Kidney cancer Neg Hx   . Prostate cancer Neg Hx   . Bladder Cancer Neg Hx     Social History:  reports that he has never smoked. He has never used smokeless tobacco. He reports that he drinks alcohol. He reports that he does not use drugs.  ROS: UROLOGY Frequent Urination?: No Hard to postpone urination?: No Burning/pain with urination?: No Get up at night to urinate?: Yes Leakage of urine?: No Urine stream starts and stops?: No Trouble starting stream?: No Do you have to strain to urinate?: No Blood in urine?: No Urinary tract infection?: No Sexually transmitted disease?: No Injury to kidneys or bladder?: No Painful intercourse?: No Weak stream?: No Erection problems?: No Penile pain?: No  Gastrointestinal Nausea?: No Vomiting?: No Indigestion/heartburn?: No Diarrhea?: No Constipation?: No  Constitutional Fever: No Night sweats?: No Weight loss?: No Fatigue?: Yes  Skin Skin rash/lesions?: No Itching?: No  Eyes Blurred vision?: No Double vision?: No  Ears/Nose/Throat Sore throat?: No Sinus problems?: Yes  Hematologic/Lymphatic Swollen glands?: No Easy bruising?: No  Cardiovascular Leg swelling?: No Chest pain?: No  Respiratory Cough?: No Shortness of breath?: No  Endocrine Excessive thirst?: No  Musculoskeletal Back pain?: No Joint pain?: Yes  Neurological Headaches?: No Dizziness?: No  Psychologic Depression?: No Anxiety?: No  Physical Exam: BP (!) 144/71   Pulse 95   Ht 5\' 11"  (1.803 m)   Wt 195 lb (88.5 kg)   BMI 27.20 kg/m   Constitutional: Well nourished. Alert and oriented, No acute distress. HEENT: [place] AT, moist mucus membranes. Trachea midline, no masses. Cardiovascular: No clubbing, cyanosis, or  edema. Respiratory: Normal respiratory effort, no increased work of breathing. GI: Abdomen is soft, non tender, non distended, no abdominal masses. Liver and spleen not palpable.  No hernias appreciated.  Stool sample for occult testing is not indicated.   GU: No CVA tenderness.  No bladder fullness or masses.  Patient with circumcised phallus.  Urethral meatus is patent.  No penile discharge. No penile lesions or rashes. Scrotum without lesions, cysts, rashes and/or edema.  Testicles are located scrotally bilaterally. No masses are appreciated in the testicles. Left and right epididymis are normal. Rectal: Patient with  normal sphincter tone. Anus and perineum without scarring or rashes. No rectal masses are appreciated. Prostate is approximately 50 grams, no nodules are appreciated. Seminal vesicles are normal. Skin: No rashes, bruises or suspicious lesions. Lymph: No cervical or inguinal adenopathy. Neurologic: Grossly intact, no focal deficits, moving all 4 extremities. Psychiatric: Normal mood and affect.   Laboratory Data: PSA History  0.22 ng/mL on 04/24/2017  0.3   ng/mL on 10/13/2016  0.31 ng/mL on 05/26/2018   Lab Results  Component Value Date   WBC 5.2 04/24/2017   HGB 14.0 05/26/2018   HCT 43.1 05/26/2018   MCV 79.3 (L) 04/24/2017   PLT 159 04/24/2017    Lab Results  Component Value Date   TESTOSTERONE 637 05/26/2018  I have reviewed the labs.  Assessment & Plan:    1. Testosterone deficiency   - most recent testosterone level is 637 ng/dL on 16/09/9603 (goal 540-981 ng/dL)  - continue testosterone cypionate 200 mg/cc, 0.5 cc q week - patient will continue self injection  - RTC in 3 months for HCT, HBG  and testosterone level only  - RTC in 6 months for HCT, HBG, testosterone, PSA, ADAM and exam  2. BPH with LUTS  - IPSS score is 2/0, it is improved  - Continue conservative management, avoiding bladder irritants and timed voiding's  - RTC in 6 months for IPSS, PSA  and exam, as testosterone therapy can cause prostate enlargement and worsen LUTS  3. Erectile dysfunction:     -SHIM score is 22, it is slightly worsening   - he would like to try Sildenafil 20 mg, 3 to 5 tablets two hours prior to intercourse on an empty stomach, # 50; he is warned not to take medications that contain nitrates.  I also advised him of the side effects, such as: headache, flushing, dyspepsia, abnormal vision, nasal congestion, back pain, myalgia, nausea, dizziness, and rash.- sent to pharmacy   -RTC in 6 months for SHIM score and exam, as testosterone therapy can affect erections   Return in about 3 months (around 08/28/2018) for PSA, testosterone (one week after injection) H & H.  These notes generated with voice recognition software. I apologize for typographical errors.  Michiel Cowboy, PA-C  [facility] [address] [place], [place] [address] [phone]

## 2018-05-28 ENCOUNTER — Other Ambulatory Visit: Payer: Self-pay

## 2018-05-28 ENCOUNTER — Ambulatory Visit (INDEPENDENT_AMBULATORY_CARE_PROVIDER_SITE_OTHER): Payer: BC Managed Care – PPO | Admitting: Urology

## 2018-05-28 ENCOUNTER — Encounter: Payer: Self-pay | Admitting: Urology

## 2018-05-28 VITALS — BP 144/71 | HR 95 | Ht 71.0 in | Wt 195.0 lb

## 2018-05-28 DIAGNOSIS — E291 Testicular hypofunction: Secondary | ICD-10-CM | POA: Diagnosis not present

## 2018-05-28 DIAGNOSIS — N401 Enlarged prostate with lower urinary tract symptoms: Secondary | ICD-10-CM

## 2018-05-28 DIAGNOSIS — N411 Chronic prostatitis: Secondary | ICD-10-CM | POA: Insufficient documentation

## 2018-05-28 DIAGNOSIS — N138 Other obstructive and reflux uropathy: Secondary | ICD-10-CM | POA: Diagnosis not present

## 2018-05-28 DIAGNOSIS — E349 Endocrine disorder, unspecified: Secondary | ICD-10-CM | POA: Diagnosis not present

## 2018-05-28 DIAGNOSIS — N529 Male erectile dysfunction, unspecified: Secondary | ICD-10-CM | POA: Diagnosis not present

## 2018-05-28 MED ORDER — TESTOSTERONE CYPIONATE 200 MG/ML IM SOLN: INTRAMUSCULAR | 0 refills | Status: DC

## 2018-05-28 MED ORDER — SILDENAFIL CITRATE 20 MG PO TABS: ORAL_TABLET | ORAL | 3 refills | Status: DC

## 2018-08-25 ENCOUNTER — Other Ambulatory Visit: Payer: Self-pay | Admitting: Unknown Physician Specialty

## 2018-08-25 DIAGNOSIS — M7542 Impingement syndrome of left shoulder: Secondary | ICD-10-CM

## 2018-09-03 ENCOUNTER — Other Ambulatory Visit
Admission: RE | Admit: 2018-09-03 | Discharge: 2018-09-03 | Disposition: A | Discharge status: Home / Self Care | Payer: BC Managed Care – PPO | Source: Ambulatory Visit | Attending: Urology | Admitting: Urology

## 2018-09-03 ENCOUNTER — Other Ambulatory Visit: Payer: Self-pay

## 2018-09-03 ENCOUNTER — Inpatient Hospital Stay: Admit: 2018-09-03 | Payer: Self-pay

## 2018-09-03 DIAGNOSIS — E349 Endocrine disorder, unspecified: Secondary | ICD-10-CM

## 2018-09-03 LAB — HEMOGLOBIN AND HEMATOCRIT, BLOOD
HCT: 44.4 % (ref 40.0–52.0)
HEMOGLOBIN: 14.5 g/dL (ref 13.0–18.0)

## 2018-09-04 LAB — TESTOSTERONE: TESTOSTERONE: 813 ng/dL (ref 264–916)

## 2018-09-22 ENCOUNTER — Ambulatory Visit
Admission: RE | Admit: 2018-09-22 | Discharge: 2018-09-22 | Disposition: A | Discharge status: Home / Self Care | Payer: BC Managed Care – PPO | Source: Ambulatory Visit | Attending: Unknown Physician Specialty | Admitting: Unknown Physician Specialty

## 2018-09-22 DIAGNOSIS — M19012 Primary osteoarthritis, left shoulder: Secondary | ICD-10-CM | POA: Insufficient documentation

## 2018-09-22 DIAGNOSIS — S43402A Unspecified sprain of left shoulder joint, initial encounter: Secondary | ICD-10-CM | POA: Diagnosis not present

## 2018-09-22 DIAGNOSIS — M7542 Impingement syndrome of left shoulder: Secondary | ICD-10-CM

## 2018-09-22 DIAGNOSIS — X58XXXA Exposure to other specified factors, initial encounter: Secondary | ICD-10-CM | POA: Diagnosis not present

## 2018-10-03 ENCOUNTER — Other Ambulatory Visit: Payer: Self-pay | Admitting: Urology

## 2018-10-03 DIAGNOSIS — E291 Testicular hypofunction: Secondary | ICD-10-CM

## 2018-12-01 ENCOUNTER — Other Ambulatory Visit
Admission: RE | Admit: 2018-12-01 | Discharge: 2018-12-01 | Disposition: A | Discharge status: Home / Self Care | Payer: BC Managed Care – PPO | Attending: Urology | Admitting: Urology

## 2018-12-01 ENCOUNTER — Other Ambulatory Visit: Payer: Self-pay | Admitting: Family Medicine

## 2018-12-01 DIAGNOSIS — E349 Endocrine disorder, unspecified: Secondary | ICD-10-CM | POA: Diagnosis not present

## 2018-12-02 LAB — TESTOSTERONE: TESTOSTERONE: 550 ng/dL (ref 264–916)

## 2018-12-03 ENCOUNTER — Ambulatory Visit: Payer: BC Managed Care – PPO | Admitting: Urology

## 2018-12-12 NOTE — Progress Notes (Signed)
12/13/2018 3:37 PM   Jeffrey Cook. January 14, 1966 [id number]  Referring provider: Orene Desanctis, MD [address] [place], [place] [address]  Chief Complaint  Patient presents with  . Hypogonadism    6 month follow up    HPI: Patient is a 52 year old Caucasian male with testosterone deficiency, erectile dysfunction and BPH with LUTS who presents today for a 6 month follow-up.  Testosterone deficiency He is still having spontaneous erections at night.   He does not have sleep apnea.   He has type 2 diabetes mellitus.  His most recent testosterone level was 550 ng/dL on 96/29/5284.  His HCT is 43.1% and Hbg is 14.0.  He is currently managing his testosterone deficiency with testosterone cypionate 200 mg/cc, 0.5 cc q weekly.      Erectile dysfunction His SHIM score is 24, which is no ED.   His previous SHIM score was 22.   His libido is preserved   His risk factors for ED are age, BPH, testosterone deficiency, DM, HTN and HLD.  He denies any painful erections or curvatures with his erections.   He is still spontaneous erections.   SHIM    Row Name 12/13/18 1512         SHIM: Over the last 6 months:   How do you rate your confidence that you could get and keep an erection?  High     When you had erections with sexual stimulation, how often were your erections hard enough for penetration (entering your partner)?  Almost Always or Always     During sexual intercourse, how often were you able to maintain your erection after you had penetrated (entered) your partner?  Almost Always or Always     During sexual intercourse, how difficult was it to maintain your erection to completion of intercourse?  Not Difficult     When you attempted sexual intercourse, how often was it satisfactory for you?  Almost Always or Always       SHIM Total Score   SHIM  24        Score: 1-7 Severe ED 8-11 Moderate ED 12-16 Mild-Moderate ED 17-21 Mild ED 22-25 No ED   BPH WITH LUTS His IPSS  score today is 5, which is mild lower urinary tract symptomatology. He is pleased with his quality life due to his urinary symptoms.  His previous IPSS score was 2/0.  His major complaints today is nocturia.  He has had these symptoms for few years.  He denies any dysuria, hematuria or suprapubic pain.   He also denies any recent fevers, chills, nausea or vomiting.  He does not have a family history of PCa. IPSS    Row Name 12/13/18 1500         International Prostate Symptom Score   How often have you had the sensation of not emptying your bladder?  Not at All     How often have you had to urinate less than every two hours?  About half the time     How often have you found you stopped and started again several times when you urinated?  Not at All     How often have you found it difficult to postpone urination?  Less than 1 in 5 times     How often have you had a weak urinary stream?  Not at All     How often have you had to strain to start urination?  Not at All  How many times did you typically get up at night to urinate?  1 Time     Total IPSS Score  5       Quality of Life due to urinary symptoms   If you were to spend the rest of your life with your urinary condition just the way it is now how would you feel about that?  Pleased        Score:  1-7 Mild 8-19 Moderate 20-35 Severe    PMH: Past Medical History:  Diagnosis Date  . Allergic rhinitis   . BPH with obstruction/lower urinary tract symptoms   . Chronic headaches   . Diabetes mellitus without complication (HCC)   . Diverticulosis   . Dyslipidemia   . Erectile dysfunction   . Fatty liver   . GERD (gastroesophageal reflux disease)   . Hypogonadism in male   . Personal history of multiple concussions   . Steatohepatitis, non-alcoholic     Surgical History: Past Surgical History:  Procedure Laterality Date  . LIVER BIOPSY  06/10/2009  . MYELOGRAM    . NASAL SINUS SURGERY      Home Medications:    Allergies as of 12/13/2018      Reactions   Codeine    Fenofibrate Other (See Comments)   Fish Oil    Other reaction(s): Other (See Comments) Dyspepsia, foul taste, loose stools - tolerates Lovaza   Niacin And Related    Prednisone    Headache   Tricor [fenofibrate]       Medication List       Accurate as of December 13, 2018  3:37 PM. Always use your most recent med list.        aspirin 81 MG tablet Take 81 mg by mouth daily.   atorvastatin 10 MG tablet Commonly known as:  LIPITOR Take 10 mg by mouth daily.   cholecalciferol 10 MCG (400 UNIT) Tabs tablet Commonly known as:  VITAMIN D3 Take 1,000 Units by mouth daily.   fenofibrate 160 MG tablet Take 160 mg by mouth daily.   lisinopril 5 MG tablet Commonly known as:  PRINIVIL,ZESTRIL Take 5 mg by mouth daily.   propranolol 10 MG tablet Commonly known as:  INDERAL Take by mouth.   sildenafil 20 MG tablet Commonly known as:  REVATIO Take 3 to 5 tablets two hours before intercouse on an empty stomach.  Do not take with nitrates.   SUMAtriptan 25 MG tablet Commonly known as:  IMITREX Take by mouth.   testosterone cypionate 200 MG/ML injection Commonly known as:  DEPOTESTOSTERONE CYPIONATE INJECT 0.75 ML IN THE MUSCLE EVERY WEEK   VASCEPA 1 g Caps Generic drug:  Icosapent Ethyl TK 2 CS PO BID   vitamin B-12 1000 MCG tablet Commonly known as:  CYANOCOBALAMIN Take by mouth.   vitamin E 400 UNIT capsule Take 800 Units by mouth daily.       Allergies:  Allergies  Allergen Reactions  . Codeine   . Fenofibrate Other (See Comments)  . Fish Oil     Other reaction(s): Other (See Comments) Dyspepsia, foul taste, loose stools - tolerates Lovaza  . Niacin And Related   . Prednisone     Headache  . Tricor [Fenofibrate]     Family History: Family History  Problem Relation Age of Onset  . Hypertension Mother   . Alcohol abuse Mother   . Diabetes Mother   . Stroke Mother   . Transient ischemic  attack Father   .  Hyperlipidemia Father   . Melanoma Father   . Heart attack Father   . Kidney cancer Neg Hx   . Prostate cancer Neg Hx   . Bladder Cancer Neg Hx     Social History:  reports that he has never smoked. He has never used smokeless tobacco. He reports current alcohol use. He reports that he does not use drugs.  ROS: UROLOGY Frequent Urination?: No Hard to postpone urination?: No Burning/pain with urination?: No Get up at night to urinate?: Yes Leakage of urine?: No Urine stream starts and stops?: No Trouble starting stream?: No Do you have to strain to urinate?: No Blood in urine?: No Urinary tract infection?: No Sexually transmitted disease?: No Injury to kidneys or bladder?: No Painful intercourse?: No Weak stream?: No Erection problems?: No Penile pain?: No  Gastrointestinal Nausea?: No Vomiting?: No Indigestion/heartburn?: Yes Diarrhea?: No Constipation?: No  Constitutional Fever: No Night sweats?: No Weight loss?: No Fatigue?: No  Skin Skin rash/lesions?: No Itching?: No  Eyes Blurred vision?: No Double vision?: No  Ears/Nose/Throat Sore throat?: No Sinus problems?: Yes  Hematologic/Lymphatic Swollen glands?: No Easy bruising?: No  Cardiovascular Leg swelling?: No Chest pain?: No  Respiratory Cough?: No Shortness of breath?: No  Endocrine Excessive thirst?: No  Musculoskeletal Back pain?: No Joint pain?: No  Neurological Headaches?: No Dizziness?: No  Psychologic Depression?: No Anxiety?: No  Physical Exam: BP 125/86 (BP Location: Left Arm, Patient Position: Sitting)   Pulse 75   Ht 5\' 11"  (1.803 m)   Wt 190 lb (86.2 kg)   BMI 26.50 kg/m   Constitutional:  Well nourished. Alert and oriented, No acute distress. HEENT: [place] AT, moist mucus membranes.  Trachea midline, no masses. Cardiovascular: No clubbing, cyanosis, or edema. Respiratory: Normal respiratory effort, no increased work of breathing. GI: Abdomen  is soft, non tender, non distended, no abdominal masses. Liver and spleen not palpable.  No hernias appreciated.  Stool sample for occult testing is not indicated.   GU: No CVA tenderness.  No bladder fullness or masses.  Patient with circumcised phallus.  Urethral meatus is patent.  No penile discharge. No penile lesions or rashes. Scrotum without lesions, cysts, rashes and/or edema.  Testicles are located scrotally bilaterally. No masses are appreciated in the testicles. Left and right epididymis are normal. Rectal: Patient with  normal sphincter tone. Anus and perineum without scarring or rashes. No rectal masses are appreciated. Prostate is approximately 45 grams, no nodules are appreciated. Seminal vesicles are normal. Skin: No rashes, bruises or suspicious lesions. Lymph: No cervical or inguinal adenopathy. Neurologic: Grossly intact, no focal deficits, moving all 4 extremities. Psychiatric: Normal mood and affect.   Laboratory Data: PSA History  0.22 ng/mL on 04/24/2017  0.3   ng/mL on 10/13/2016  0.31 ng/mL on 05/26/2018   Lab Results  Component Value Date   WBC 5.2 04/24/2017   HGB 14.5 09/03/2018   HCT 44.4 09/03/2018   MCV 79.3 (L) 04/24/2017   PLT 159 04/24/2017    Lab Results  Component Value Date   TESTOSTERONE 550 12/01/2018  I have reviewed the labs.  Assessment & Plan:    1. Testosterone deficiency   - most recent testosterone level is 550 ng/dL [id number]/8295on12/10/2018 (goal [id number] ng/dL)  - continue testosterone cypionate 200 mg/cc, 0.5 cc q week - patient will continue self injection  - RTC in 6 months for HCT, HBG, testosterone, PSA, ADAM and exam  2. BPH with LUTS  - IPSS score is 5/1, it  is worsened  - Continue conservative management, avoiding bladder irritants and timed voiding's  - RTC in 6 months for IPSS, PSA and exam, as testosterone therapy can cause prostate enlargement and worsen LUTS  3. Erectile dysfunction:     -SHIM score is 24, it is improved  - he  would like to try Sildenafil 20 mg, 3 to 5 tablets two hours prior to intercourse on an empty stomach, # 50; he is warned not to take medications that contain nitrates.  I also advised him of the side effects, such as: headache, flushing, dyspepsia, abnormal vision, nasal congestion, back pain, myalgia, nausea, dizziness, and rash.- sent to pharmacy   -RTC in 6 months for SHIM score and exam, as testosterone therapy can affect erections   Return in about 6 months (around 06/14/2019) for PSA, testosterone (one week after injection) H & H, IPSS, SHIM and exam.  These notes generated with voice recognition software. I apologize for typographical errors.  Michiel CowboySHANNON Dorea Duff, PA-C  [facility] [address] [place], [place] [id number] [phone]

## 2018-12-13 ENCOUNTER — Ambulatory Visit (INDEPENDENT_AMBULATORY_CARE_PROVIDER_SITE_OTHER): Payer: BC Managed Care – PPO | Admitting: Urology

## 2018-12-13 ENCOUNTER — Encounter: Payer: Self-pay | Admitting: Urology

## 2018-12-13 VITALS — BP 125/86 | HR 75 | Ht 71.0 in | Wt 190.0 lb

## 2018-12-13 DIAGNOSIS — E349 Endocrine disorder, unspecified: Secondary | ICD-10-CM

## 2018-12-13 DIAGNOSIS — N529 Male erectile dysfunction, unspecified: Secondary | ICD-10-CM

## 2018-12-13 DIAGNOSIS — E291 Testicular hypofunction: Secondary | ICD-10-CM | POA: Diagnosis not present

## 2018-12-13 DIAGNOSIS — N401 Enlarged prostate with lower urinary tract symptoms: Secondary | ICD-10-CM

## 2018-12-13 DIAGNOSIS — N138 Other obstructive and reflux uropathy: Secondary | ICD-10-CM

## 2018-12-13 MED ORDER — SILDENAFIL CITRATE 20 MG PO TABS: ORAL_TABLET | ORAL | 3 refills | Status: DC

## 2018-12-13 MED ORDER — TESTOSTERONE CYPIONATE 200 MG/ML IM SOLN: INTRAMUSCULAR | 0 refills | Status: DC

## 2018-12-13 NOTE — Patient Instructions (Signed)
.  bua

## 2019-01-27 ENCOUNTER — Encounter: Payer: Self-pay | Admitting: Emergency Medicine

## 2019-01-27 ENCOUNTER — Ambulatory Visit
Admission: EM | Admit: 2019-01-27 | Discharge: 2019-01-27 | Disposition: A | Discharge status: Home / Self Care | Payer: BC Managed Care – PPO | Attending: Family Medicine | Admitting: Family Medicine

## 2019-01-27 ENCOUNTER — Other Ambulatory Visit: Payer: Self-pay

## 2019-01-27 DIAGNOSIS — R0981 Nasal congestion: Secondary | ICD-10-CM | POA: Diagnosis not present

## 2019-01-27 DIAGNOSIS — J111 Influenza due to unidentified influenza virus with other respiratory manifestations: Secondary | ICD-10-CM

## 2019-01-27 DIAGNOSIS — R05 Cough: Secondary | ICD-10-CM

## 2019-01-27 DIAGNOSIS — J019 Acute sinusitis, unspecified: Secondary | ICD-10-CM

## 2019-01-27 DIAGNOSIS — R509 Fever, unspecified: Secondary | ICD-10-CM

## 2019-01-27 MED ORDER — OSELTAMIVIR PHOSPHATE 75 MG PO CAPS: 75.0000 mg | ORAL_CAPSULE | Freq: Two times a day (BID) | ORAL | 0 refills | Status: DC

## 2019-01-27 NOTE — ED Triage Notes (Signed)
Patient in today c/o cough x 3 days and  body aches and fever (102-103) x 2 days. Patient has tried OTC Tylenol sinus and cold/flu.

## 2019-01-27 NOTE — ED Provider Notes (Signed)
[facility]    CSN: [id number] Arrival date & time: 01/27/19  1134  History   Chief Complaint Chief Complaint  Patient presents with  . Cough  . Generalized Body Aches  . Fever   HPI   53 year old male presents with suspected influenza.  Patient reports no known sick the past 2 days.  He reports cough, body aches.  Fever, sinus pressure and congestion.  He has tried over-the-counter Tylenol as well as cold and flu medication without resolution.  He has had recent sick contacts.  No known exacerbating factors.  No other associated symptoms.  No other complaints.  PMH, Surgical Hx, Family Hx, Social History reviewed and updated as below.  Past Medical History:  Diagnosis Date  . Allergic rhinitis   . BPH with obstruction/lower urinary tract symptoms   . Chronic headaches   . Diabetes mellitus without complication (HCC)   . Diverticulosis   . Dyslipidemia   . Erectile dysfunction   . Fatty liver   . GERD (gastroesophageal reflux disease)   . Hypogonadism in male   . Personal history of multiple concussions   . Steatohepatitis, non-alcoholic    Patient Active Problem List   Diagnosis Date Noted  . Chronic prostatitis 05/28/2018  . Abnormal ECG 05/04/2018  . Chronic pain of left knee 10/08/2017  . Impingement syndrome of left shoulder 10/08/2017  . Numbness and tingling of upper extremity 10/08/2017  . Migraine without aura and without status migrainosus, not intractable 09/23/2017  . Erectile dysfunction of non-organic origin 04/14/2016  . Non-seasonal allergic rhinitis 03/23/2016  . Hypogonadism in male 10/17/2015  . BPH with obstruction/lower urinary tract symptoms 10/17/2015  . Balanitis 10/17/2015  . Polypharmacy 04/18/2015  . Encounter for long-term (current) use of medications 04/18/2015  . Muscle ache 10/13/2014  . Avitaminosis D 03/29/2014  . Abscess or cellulitis of leg 11/08/2012  . Fatty liver disease, nonalcoholic 01/07/2012  . Acid reflux  01/07/2012  . 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 Diabetes mellitus, type 2 (HCC) 01/06/2012  . Hypertriglyceridemia 01/06/2012   Past Surgical History:  Procedure Laterality Date  . LIVER BIOPSY  06/10/2009  . MYELOGRAM    . NASAL SINUS SURGERY     Home Medications    Prior to Admission medications   Medication Sig Start Date End Date Taking? Authorizing Provider  aspirin 81 MG tablet Take 81 mg by mouth daily.   Yes [provider]  atorvastatin (LIPITOR) 10 MG tablet Take 10 mg by mouth daily.   Yes [provider]  cholecalciferol (VITAMIN D) 400 UNITS TABS tablet Take 1,000 Units by mouth daily.   Yes [provider]  fenofibrate 160 MG tablet Take 160 mg by mouth daily.   Yes [provider]  lisinopril (PRINIVIL,ZESTRIL) 5 MG tablet Take 5 mg by mouth daily.   Yes [provider]  propranolol (INDERAL) 10 MG tablet Take by mouth. 09/23/17 01/27/19 Yes [provider]  sildenafil (REVATIO) 20 MG tablet Take 3 to 5 tablets two hours before intercouse on an empty stomach.  Do not take with nitrates. 12/13/18  Yes McGowan, [name] A, PA-C  SUMAtriptan (IMITREX) 25 MG tablet Take by mouth. 09/23/17  Yes [provider]  testosterone cypionate (DEPOTESTOSTERONE CYPIONATE) 200 MG/ML injection INJECT 0.75 ML IN THE MUSCLE EVERY WEEK 12/13/18  Yes McGowan, [name] A, PA-C  VASCEPA 1 g CAPS TK 2 CS PO BID 09/26/17  Yes [provider]  vitamin B-12 (CYANOCOBALAMIN) 1000 MCG tablet Take by  mouth.   Yes [provider]  vitamin E 400 UNIT capsule Take 800 Units by mouth daily.   Yes [provider]  oseltamivir (TAMIFLU) 75 MG capsule Take 1 capsule (75 mg total) by mouth every 12 (twelve) hours. 01/27/19   Tommie Sams, DO    Family History Family History  Problem Relation Age of Onset  . Hypertension Mother   . Alcohol abuse Mother   . Diabetes Mother   . Stroke Mother   . Transient ischemic attack  Father   . Hyperlipidemia Father   . Melanoma Father   . Heart attack Father   . Kidney cancer Neg Hx   . Prostate cancer Neg Hx   . Bladder Cancer Neg Hx     Social History Social History   Tobacco Use  . Smoking status: Never Smoker  . Smokeless tobacco: Never Used  Substance Use Topics  . Alcohol use: Yes    Alcohol/week: 0.0 standard drinks    Comment: occasional  . Drug use: No     Allergies   Codeine; Fenofibrate; Fish oil; Niacin and related; Prednisone; and Tricor [fenofibrate]   Review of Systems Review of Systems  Constitutional: Positive for fever.  HENT: Positive for congestion, sinus pressure and sinus pain.   Respiratory: Positive for cough.   Musculoskeletal:       Body aches.   Physical Exam Triage Vital Signs ED Triage Vitals [01/27/19 1159]  Enc Vitals Group     BP (!) 140/95     Pulse Rate 89     Resp 18     Temp 98.6 F (37 C)     Temp Source Oral     SpO2 98 %     Weight 190 lb (86.2 kg)     Height 5\' 11"  (1.803 m)     Head Circumference      Peak Flow      Pain Score 6     Pain Loc      Pain Edu?      Excl. in GC?    Updated Vital Signs BP (!) 140/95 (BP Location: Left Arm)   Pulse 89   Temp 98.6 F (37 C) (Oral)   Resp 18   Ht 5\' 11"  (1.803 m)   Wt 86.2 kg   SpO2 98%   BMI 26.50 kg/m   Visual Acuity Right Eye Distance:   Left Eye Distance:   Bilateral Distance:    Right Eye Near:   Left Eye Near:    Bilateral Near:     Physical Exam Vitals signs and nursing note reviewed.  Constitutional:      General: He is not in acute distress.    Appearance: Normal appearance.  HENT:     Head: Normocephalic and atraumatic.     Right Ear: Tympanic membrane normal.     Left Ear: Tympanic membrane normal.     Mouth/Throat:     Pharynx: Oropharynx is clear.  Eyes:     General:        Right eye: No discharge.        Left eye: No discharge.     Conjunctiva/sclera: Conjunctivae normal.  Cardiovascular:     Rate and  Rhythm: Normal rate and regular rhythm.  Pulmonary:     Effort: Pulmonary effort is normal.     Breath sounds: Normal breath sounds.  Neurological:     Mental Status: He is alert.  Psychiatric:  Mood and Affect: Mood normal.        Behavior: Behavior normal.    UC Treatments / Results  Labs (all labs ordered are listed, but only abnormal results are displayed) Labs Reviewed - No data to display  EKG None  Radiology No results found.  Procedures Procedures (including critical care time)  Medications Ordered in UC Medications - No data to display  Initial Impression / Assessment and Plan / UC Course  I have reviewed the triage vital signs and the nursing notes.  Pertinent labs & imaging results that were available during my care of the patient were reviewed by me and considered in my medical decision making (see chart for details).    53 year old male pres66ents with influenza.  Treating with Tamiflu.  Final Clinical Impressions(s) / UC Diagnoses   Final diagnoses:  Influenza   Discharge Instructions   None    ED Prescriptions    Medication Sig Dispense Auth. Provider   oseltamivir (TAMIFLU) 75 MG capsule Take 1 capsule (75 mg total) by mouth every 12 (twelve) hours. 10 capsule Tommie Samsook, Kelie Gainey G, DO     Controlled Substance Prescriptions [place] Controlled Substance Registry consulted? Not Applicable   Tommie SamsCook, Garik Diamant G, DO 01/27/19 1301

## 2019-06-03 ENCOUNTER — Other Ambulatory Visit: Payer: Self-pay | Admitting: Urology

## 2019-06-03 DIAGNOSIS — E291 Testicular hypofunction: Secondary | ICD-10-CM

## 2019-06-03 MED ORDER — TESTOSTERONE CYPIONATE 200 MG/ML IM SOLN: INTRAMUSCULAR | 0 refills | Status: DC

## 2019-06-03 NOTE — Telephone Encounter (Signed)
Pt will need more testosterone prior to his appt that was moved to July.

## 2019-06-03 NOTE — Telephone Encounter (Signed)
Okay to refill testosterone cypionate.  

## 2019-06-03 NOTE — Telephone Encounter (Signed)
Please advise 

## 2019-06-03 NOTE — Telephone Encounter (Signed)
Rx sent to pharmacy   

## 2019-06-20 ENCOUNTER — Ambulatory Visit: Payer: BC Managed Care – PPO | Admitting: Urology

## 2019-06-30 ENCOUNTER — Other Ambulatory Visit: Payer: Self-pay

## 2019-06-30 ENCOUNTER — Other Ambulatory Visit
Admission: RE | Admit: 2019-06-30 | Discharge: 2019-06-30 | Disposition: A | Discharge status: Home / Self Care | Payer: BC Managed Care – PPO | Attending: Urology | Admitting: Urology

## 2019-06-30 DIAGNOSIS — N401 Enlarged prostate with lower urinary tract symptoms: Secondary | ICD-10-CM | POA: Diagnosis present

## 2019-06-30 DIAGNOSIS — E349 Endocrine disorder, unspecified: Secondary | ICD-10-CM | POA: Diagnosis present

## 2019-06-30 DIAGNOSIS — N138 Other obstructive and reflux uropathy: Secondary | ICD-10-CM | POA: Insufficient documentation

## 2019-06-30 LAB — HEMOGLOBIN AND HEMATOCRIT, BLOOD
HCT: 40.9 % (ref 39.0–52.0)
Hemoglobin: 13.2 g/dL (ref 13.0–17.0)

## 2019-06-30 LAB — PSA: Prostatic Specific Antigen: 0.24 ng/mL (ref 0.00–4.00)

## 2019-07-01 LAB — TESTOSTERONE: Testosterone: 485 ng/dL (ref 264–916)

## 2019-07-10 NOTE — Progress Notes (Signed)
07/11/2019 2:45 PM   Jeffrey Cook. [birth year] [id number]  Referring provider: Barbaraann Boys, MD [address] [place],  [place] [id number]  Chief Complaint  Patient presents with  . Follow-up    HPI: Patient is a 53 year old male with testosterone deficiency, erectile dysfunction and BPH with LUTS who presents today for a 6 month follow-up.  Since his last visit, he has had episodes of gross hematuria and left flank pain that started four days ago.  The pain started in his mid upper back and then radiated to the left flank area and through to the left groin area.  He had the gross hematuria two days later.  He states the blood and pain have resolved, but he has not passed a stone.    His UA today was negative for hematuria.    Testosterone deficiency He is still having spontaneous erections at night.   He does not have sleep apnea.   He has type 2 diabetes mellitus.  His most recent testosterone level was 485 ng/dL on 06/30/2019.  His HCT is 40.9 % and Hbg is 13.2.  He is currently managing his testosterone deficiency with testosterone cypionate 200 mg/cc, 0.5 cc q weekly.     Erectile dysfunction His SHIM score is 24, which is no ED.   His previous SHIM score was 24.   His libido is preserved   His risk factors for ED are age, BPH, testosterone deficiency, DM, HTN and HLD.  He denies any painful erections or curvatures with his erections.   He is still spontaneous erections.   SHIM    Row Name 07/11/19 1432         SHIM: Over the last 6 months:   How do you rate your confidence that you could get and keep an erection?  High     When you had erections with sexual stimulation, how often were your erections hard enough for penetration (entering your partner)?  Almost Always or Always     During sexual intercourse, how often were you able to maintain your erection after you had penetrated (entered) your partner?  Almost Always or Always     During sexual intercourse, how  difficult was it to maintain your erection to completion of intercourse?  Not Difficult     When you attempted sexual intercourse, how often was it satisfactory for you?  Almost Always or Always       SHIM Total Score   SHIM  24        Score: 1-7 Severe ED 8-11 Moderate ED 12-16 Mild-Moderate ED 17-21 Mild ED 22-25 No ED   BPH WITH LUTS His IPSS score today is 6, which is mild lower urinary tract symptomatology. He is pleased with his quality life due to his urinary symptoms.  His previous IPSS score was 5/1.  His major complaints today are urgency, painful urination and nocturia.  He denies any dysuria, hematuria or suprapubic pain.   He also denies any recent fevers, chills, nausea or vomiting.  He does not have a family history of PCa. IPSS    Row Name 07/11/19 1400         International Prostate Symptom Score   How often have you had the sensation of not emptying your bladder?  Not at All     How often have you had to urinate less than every two hours?  Less than half the time     How often have you found you  stopped and started again several times when you urinated?  Not at All     How often have you found it difficult to postpone urination?  Less than half the time     How often have you had a weak urinary stream?  Not at All     How often have you had to strain to start urination?  Not at All     How many times did you typically get up at night to urinate?  2 Times     Total IPSS Score  6       Quality of Life due to urinary symptoms   If you were to spend the rest of your life with your urinary condition just the way it is now how would you feel about that?  Pleased        Score:  1-7 Mild 8-19 Moderate 20-35 Severe    PMH: Past Medical History:  Diagnosis Date  . Allergic rhinitis   . BPH with obstruction/lower urinary tract symptoms   . Chronic headaches   . Diabetes mellitus without complication (HCC)   . Diverticulosis   . Dyslipidemia   . Erectile  dysfunction   . Fatty liver   . GERD (gastroesophageal reflux disease)   . Hypogonadism in male   . Personal history of multiple concussions   . Steatohepatitis, non-alcoholic     Surgical History: Past Surgical History:  Procedure Laterality Date  . LIVER BIOPSY  06/10/2009  . MYELOGRAM    . NASAL SINUS SURGERY      Home Medications:  Allergies as of 07/11/2019      Reactions   Codeine    Fenofibrate Other (See Comments)   Fish Oil    Other reaction(s): Other (See Comments) Dyspepsia, foul taste, loose stools - tolerates Lovaza   Niacin And Related    Prednisone    Headache   Tricor [fenofibrate]       Medication List       Accurate as of July 11, 2019  2:45 PM. If you have any questions, ask your nurse or doctor.        STOP taking these medications   oseltamivir 75 MG capsule Commonly known as: TAMIFLU Stopped by: Michiel CowboySHANNON Darus Hershman, PA-C     TAKE these medications   aspirin 81 MG tablet Take 81 mg by mouth daily.   atorvastatin 10 MG tablet Commonly known as: LIPITOR Take 10 mg by mouth daily.   cholecalciferol 10 MCG (400 UNIT) Tabs tablet Commonly known as: VITAMIN D3 Take 1,000 Units by mouth daily.   fenofibrate 160 MG tablet Take 160 mg by mouth daily.   lisinopril 5 MG tablet Commonly known as: ZESTRIL Take 5 mg by mouth daily.   propranolol 10 MG tablet Commonly known as: INDERAL Take by mouth.   sildenafil 20 MG tablet Commonly known as: REVATIO Take 3 to 5 tablets two hours before intercouse on an empty stomach.  Do not take with nitrates.   SUMAtriptan 25 MG tablet Commonly known as: IMITREX Take by mouth.   testosterone cypionate 200 MG/ML injection Commonly known as: DEPOTESTOSTERONE CYPIONATE INJECT 0.75 ML IN THE MUSCLE EVERY WEEK   Vascepa 1 g Caps Generic drug: Icosapent Ethyl TK 2 CS PO BID   vitamin B-12 1000 MCG tablet Commonly known as: CYANOCOBALAMIN Take by mouth.   vitamin E 400 UNIT capsule Take 800 Units  by mouth daily.       Allergies:  Allergies  Allergen  Reactions  . Codeine   . Fenofibrate Other (See Comments)  . Fish Oil     Other reaction(s): Other (See Comments) Dyspepsia, foul taste, loose stools - tolerates Lovaza  . Niacin And Related   . Prednisone     Headache  . Tricor [Fenofibrate]     Family History: Family History  Problem Relation Age of Onset  . Hypertension Mother   . Alcohol abuse Mother   . Diabetes Mother   . Stroke Mother   . Transient ischemic attack Father   . Hyperlipidemia Father   . Melanoma Father   . Heart attack Father   . Kidney cancer Neg Hx   . Prostate cancer Neg Hx   . Bladder Cancer Neg Hx     Social History:  reports that he has never smoked. He has never used smokeless tobacco. He reports current alcohol use. He reports that he does not use drugs.  ROS: UROLOGY Frequent Urination?: No Hard to postpone urination?: Yes Burning/pain with urination?: Yes Get up at night to urinate?: Yes Leakage of urine?: No Urine stream starts and stops?: No Trouble starting stream?: No Do you have to strain to urinate?: No Blood in urine?: Yes Urinary tract infection?: No Sexually transmitted disease?: No Injury to kidneys or bladder?: No Painful intercourse?: No Weak stream?: No Erection problems?: No Penile pain?: No  Gastrointestinal Nausea?: No Vomiting?: No Indigestion/heartburn?: No Diarrhea?: No Constipation?: No  Constitutional Fever: No Night sweats?: No Weight loss?: No Fatigue?: No  Skin Skin rash/lesions?: No Itching?: No  Eyes Blurred vision?: No Double vision?: No  Ears/Nose/Throat Sore throat?: No Sinus problems?: No  Hematologic/Lymphatic Swollen glands?: Yes Easy bruising?: No  Cardiovascular Leg swelling?: No Chest pain?: No  Respiratory Cough?: No Shortness of breath?: No  Endocrine Excessive thirst?: No  Musculoskeletal Back pain?: No Joint pain?: Yes  Neurological Headaches?:  No Dizziness?: No  Psychologic Depression?: No Anxiety?: No  Physical Exam: BP 108/78   Pulse 85   Temp 98.3 F (36.8 C) (Temporal)   Resp 18   Ht 5' 11.5" (1.816 m)   Wt 196 lb (88.9 kg)   SpO2 98%   BMI 26.96 kg/m   Constitutional:  Well nourished. Alert and oriented, No acute distress. HEENT: [place] AT, moist mucus membranes.  Trachea midline, no masses. Cardiovascular: No clubbing, cyanosis, or edema. Respiratory: Normal respiratory effort, no increased work of breathing. GI: Abdomen is soft, non tender, non distended, no abdominal masses. Liver and spleen not palpable.  No hernias appreciated.  Stool sample for occult testing is not indicated.   GU: No CVA tenderness.  No bladder fullness or masses.  Patient with circumcised phallus.  Urethral meatus is patent.  No penile discharge. No penile lesions or rashes. Scrotum without lesions, cysts, rashes and/or edema.  Testicles are located scrotally bilaterally. No masses are appreciated in the testicles. Left and right epididymis are normal. Rectal: Patient with  normal sphincter tone. Anus and perineum without scarring or rashes. No rectal masses are appreciated. Prostate is approximately 50 grams, no nodules are appreciated. Seminal vesicles are normal. Skin: No rashes, bruises or suspicious lesions. Lymph: No inguinal adenopathy. Neurologic: Grossly intact, no focal deficits, moving all 4 extremities. Psychiatric: Normal mood and affect.   Laboratory Data:  Urinalysis UA bland.  See Epic.   PSA History  0.22 ng/mL on 04/24/2017  0.3   ng/mL on 10/13/2016  0.31 ng/mL on 05/26/2018  0.24 ng/mL on 06/30/2019   Lab Results  Component  Value Date   WBC 5.2 04/24/2017   HGB 13.2 06/30/2019   HCT 40.9 06/30/2019   MCV 79.3 (L) 04/24/2017   PLT 159 04/24/2017    Lab Results  Component Value Date   TESTOSTERONE 485 06/30/2019  I have reviewed the labs.  Assessment & Plan:    1. Gross hematuria (high risk)  I  explained to the patient that there are a number of causes that can be associated with blood in the urine, such as stones, BPH, UTI's, damage to the urinary tract and/or cancer - we did discuss that his symptoms were likely the cause of a stone passage or passing, but urological cancer could also be present The new AUA guidelines for hematuria state that a CT urogram is the preferred imaging study to evaluate hematuria along with cystoscopy as he is at high risk I explained to the patient that a contrast material will be injected into a vein and that in rare instances, an allergic reaction can result and may even life threatening (1:100,000)  The patient denies any allergies to contrast, iodine and/or seafood and is not taking metformin Following the imaging study,  I've recommended a cystoscopy. I described how this is performed, typically in an office setting with a flexible cystoscope. We described the risks, benefits, and possible side effects, the most common of which is a minor amount of blood in the urine and/or burning which usually resolves in 24 to 48 hours.  The patient had the opportunity to ask questions which were answered. Based upon this discussion, the patient is willing to proceed. Therefore, I've ordered: a CT Urogram and cystoscopy. The patient will return following all of the above for discussion of the results.  UA is negative for hematuria Urine culture pending  BUN + creatinine pending   2. Testosterone deficiency  Most recent testosterone level is 485 ng/dL on 16/10/960407/08/2019 (goal [phone] ng/dL) Continue testosterone cypionate 200 mg/cc, 0.5 cc q week - patient will continue self injection - refills given RTC in 6 months for HCT, HBG, testosterone, PSA and exam  3. BPH with LUTS IPSS score is 6/1, it is worsened - due to possible passage of stone Continue conservative management, avoiding bladder irritants and timed voiding's RTC in 6 months for IPSS, PSA and exam, as  testosterone therapy can cause prostate enlargement and worsen LUTS  4. Erectile dysfunction:    SHIM score is 24, it is stable  Continue sildenafil 20 mg RTC in 6 months for SHIM score and exam, as testosterone therapy can affect erections  Return for CT Urogram report and cystoscopy.  These notes generated with voice recognition software. I apologize for typographical errors.  Michiel CowboySHANNON Luellen Howson, PA-C  [facility] [address] [place], [place] [address] [phone]

## 2019-07-11 ENCOUNTER — Other Ambulatory Visit
Admission: RE | Admit: 2019-07-11 | Discharge: 2019-07-11 | Disposition: A | Discharge status: Home / Self Care | Payer: BC Managed Care – PPO | Attending: Urology | Admitting: Urology

## 2019-07-11 ENCOUNTER — Encounter: Payer: Self-pay | Admitting: Urology

## 2019-07-11 ENCOUNTER — Other Ambulatory Visit: Payer: Self-pay

## 2019-07-11 ENCOUNTER — Ambulatory Visit (INDEPENDENT_AMBULATORY_CARE_PROVIDER_SITE_OTHER): Payer: BC Managed Care – PPO | Admitting: Urology

## 2019-07-11 VITALS — BP 108/78 | HR 85 | Temp 98.3°F | Resp 18 | Ht 71.5 in | Wt 196.0 lb

## 2019-07-11 DIAGNOSIS — N529 Male erectile dysfunction, unspecified: Secondary | ICD-10-CM | POA: Diagnosis not present

## 2019-07-11 DIAGNOSIS — N138 Other obstructive and reflux uropathy: Secondary | ICD-10-CM

## 2019-07-11 DIAGNOSIS — E349 Endocrine disorder, unspecified: Secondary | ICD-10-CM | POA: Diagnosis not present

## 2019-07-11 DIAGNOSIS — E291 Testicular hypofunction: Secondary | ICD-10-CM

## 2019-07-11 DIAGNOSIS — R310 Gross hematuria: Secondary | ICD-10-CM | POA: Diagnosis present

## 2019-07-11 DIAGNOSIS — N401 Enlarged prostate with lower urinary tract symptoms: Secondary | ICD-10-CM

## 2019-07-11 LAB — URINALYSIS, COMPLETE (UACMP) WITH MICROSCOPIC
Bilirubin Urine: NEGATIVE
Glucose, UA: NEGATIVE mg/dL
Hgb urine dipstick: NEGATIVE
Ketones, ur: NEGATIVE mg/dL
Leukocytes,Ua: NEGATIVE
Nitrite: NEGATIVE
Protein, ur: NEGATIVE mg/dL
RBC / HPF: NONE SEEN RBC/hpf (ref 0–5)
Specific Gravity, Urine: >1.03 — ABNORMAL HIGH (ref 1.005–1.030)
Squamous Epithelial / HPF: NONE SEEN (ref 0–5)
pH: 5.5 (ref 5.0–8.0)

## 2019-07-11 LAB — BASIC METABOLIC PANEL
Anion gap: 10 (ref 5–15)
BUN: 18 mg/dL (ref 6–20)
CO2: 23 mmol/L (ref 22–32)
Calcium: 8.9 mg/dL (ref 8.9–10.3)
Chloride: 103 mmol/L (ref 98–111)
Creatinine, Ser: 1.12 mg/dL (ref 0.61–1.24)
GFR calc Af Amer: >60 mL/min (ref >60)
GFR calc non Af Amer: >60 mL/min (ref >60)
Glucose, Bld: 140 mg/dL — ABNORMAL HIGH (ref 70–99)
Potassium: 4.1 mmol/L (ref 3.5–5.1)
Sodium: 136 mmol/L (ref 135–145)

## 2019-07-11 MED ORDER — SILDENAFIL CITRATE 20 MG PO TABS: ORAL_TABLET | ORAL | 3 refills | Status: DC

## 2019-07-11 MED ORDER — TESTOSTERONE CYPIONATE 200 MG/ML IM SOLN: INTRAMUSCULAR | 0 refills | Status: DC

## 2019-07-11 MED ORDER — TESTOSTERONE CYPIONATE 200 MG/ML IM SOLN: INTRAMUSCULAR | 1 refills | Status: DC

## 2019-07-12 LAB — URINE CULTURE: Culture: NO GROWTH

## 2019-07-25 ENCOUNTER — Ambulatory Visit
Admission: RE | Admit: 2019-07-25 | Discharge: 2019-07-25 | Disposition: A | Discharge status: Home / Self Care | Payer: BC Managed Care – PPO | Source: Ambulatory Visit | Attending: Urology | Admitting: Urology

## 2019-07-25 ENCOUNTER — Other Ambulatory Visit: Payer: Self-pay

## 2019-07-25 DIAGNOSIS — R310 Gross hematuria: Secondary | ICD-10-CM | POA: Diagnosis present

## 2019-07-25 MED ORDER — IOHEXOL 300 MG/ML  SOLN
125.0000 mL | Freq: Once | INTRAMUSCULAR | Status: AC | PRN
  Administered 2019-07-25: 09:00:00 125 mL via INTRAVENOUS

## 2019-08-01 ENCOUNTER — Other Ambulatory Visit: Payer: BC Managed Care – PPO | Admitting: Urology

## 2019-08-02 ENCOUNTER — Other Ambulatory Visit: Payer: Self-pay

## 2019-08-02 DIAGNOSIS — R310 Gross hematuria: Secondary | ICD-10-CM

## 2019-08-05 ENCOUNTER — Other Ambulatory Visit: Payer: Self-pay

## 2019-08-05 ENCOUNTER — Ambulatory Visit (INDEPENDENT_AMBULATORY_CARE_PROVIDER_SITE_OTHER): Payer: BC Managed Care – PPO | Admitting: Urology

## 2019-08-05 ENCOUNTER — Encounter: Payer: Self-pay | Admitting: Urology

## 2019-08-05 VITALS — BP 129/77 | HR 84

## 2019-08-05 DIAGNOSIS — R3129 Other microscopic hematuria: Secondary | ICD-10-CM

## 2019-08-05 DIAGNOSIS — N200 Calculus of kidney: Secondary | ICD-10-CM

## 2019-08-05 DIAGNOSIS — E291 Testicular hypofunction: Secondary | ICD-10-CM

## 2019-08-05 NOTE — Progress Notes (Signed)
   08/08/19  CC:  Chief Complaint  Patient presents with  . Cysto    HPI: 53 year old male who presents today for for cystoscopy to complete his microscopic hematuria evaluation.  He underwent CT urogram which shows punctate stones bilaterally, otherwise no GU pathology.  He does have a history of hypogonadism managed by [facility] Council.  Blood pressure 129/77, pulse 84. NED. A&Ox3.   No respiratory distress   Abd soft, NT, ND Normal phallus with bilateral descended testicles  Cystoscopy Procedure Note  Patient identification was confirmed, informed consent was obtained, and patient was prepped using Betadine solution.  Lidocaine jelly was administered per urethral meatus.     Pre-Procedure: - Inspection reveals a normal caliber ureteral meatus.  Procedure: The flexible cystoscope was introduced without difficulty - No urethral strictures/lesions are present. - Normal prostate  - Normal bladder neck - Bilateral ureteral orifices identified - Bladder mucosa  reveals no ulcers, tumors, or lesions - No bladder stones - No trabeculation  Retroflexion unremarkable   Post-Procedure: - Patient tolerated the procedure well  Assessment/ Plan:  1. Microscopic hematuria Status post hematuria evaluation Bladder and prostate are fairly unremarkable CT urogram is also reassuring, punctate stones otherwise no etiology for microscopic blood  2. Kidney stones Tate stones, no intervention recommended Supportive care Increase fluids  3. Hypogonadism in male Followed by [facility] Council, encouraged to follow-up at 58-month interval as previously recommended   Hollice Espy, MD

## 2020-01-08 NOTE — Progress Notes (Signed)
01/09/2020 7:59 AM   Jeffrey Cook. 1966-08-27 [id number]  Referring provider: Orene Desanctis, MD [address] [place],  [place] [address]  Chief Complaint  Patient presents with  . Hypogonadism    HPI: Patient is a 54 year old male with testosterone deficiency, erectile dysfunction and BPH with LUTS who presents today for a 6 month follow-up.  History of hematuria (high risk) Non-smoker.  CTU 07/2019 Punctate 1-2 mm stone inferior pole right kidney.  No ureterolithiasis.  No hydronephrosis.  No suspicious enhancing renal masses.  No abnormal filling defects within the opacified renal collecting systems and ureters.  Pulmonary nodules measuring up to 4 mm. No follow-up needed if patient is low-risk (and has no known or suspected primary neoplasm). Non-contrast chest CT can be considered in 12 months if patient is high-risk. Hepatic steatosis.  Cysto with Dr. Apolinar Junes 07/2019 NED.  He denies any gross hematuria.  UA 01/09/2020 no RBC's.    Testosterone deficiency He is still having spontaneous erections at night.   He does not have sleep apnea.   He has type 2 diabetes mellitus.  His most recent testosterone level was 500 ng/dL on 37/09/6268.  His HCT is 45.3 % and Hbg is 14.4.  He is currently managing his testosterone deficiency with testosterone cypionate 200 mg/cc, 0.5 cc q weekly.     Erectile dysfunction His SHIM score is 24, which is no ED.   His previous SHIM score was 24.   His libido is preserved   His risk factors for ED are age, BPH, testosterone deficiency, DM, HTN and HLD.  He denies any painful erections or curvatures with his erections.   He is still spontaneous erections.   SHIM    Row Name 01/09/20 1531         SHIM: Over the last 6 months:   How do you rate your confidence that you could get and keep an erection?  High     When you had erections with sexual stimulation, how often were your erections hard enough for penetration (entering your partner)?  Almost  Always or Always     During sexual intercourse, how often were you able to maintain your erection after you had penetrated (entered) your partner?  Almost Always or Always     During sexual intercourse, how difficult was it to maintain your erection to completion of intercourse?  Not Difficult     When you attempted sexual intercourse, how often was it satisfactory for you?  Almost Always or Always       SHIM Total Score   SHIM  24        Score: 1-7 Severe ED 8-11 Moderate ED 12-16 Mild-Moderate ED 17-21 Mild ED 22-25 No ED   BPH WITH LUTS His IPSS score today is 3, which is mild lower urinary tract symptomatology. He is pleased with his quality life due to his urinary symptoms.  His previous IPSS score was 6/1.  His major complaints are nocturia x 1-2.   He denies any dysuria, hematuria or suprapubic pain.   He also denies any recent fevers, chills, nausea or vomiting.  He does not have a family history of PCa. IPSS    Row Name 01/09/20 1500         International Prostate Symptom Score   How often have you had the sensation of not emptying your bladder?  Not at All     How often have you had to urinate less than every two  hours?  Less than 1 in 5 times     How often have you found you stopped and started again several times when you urinated?  Not at All     How often have you found it difficult to postpone urination?  Less than 1 in 5 times     How often have you had a weak urinary stream?  Not at All     How often have you had to strain to start urination?  Not at All     How many times did you typically get up at night to urinate?  1 Time     Total IPSS Score  3       Quality of Life due to urinary symptoms   If you were to spend the rest of your life with your urinary condition just the way it is now how would you feel about that?  Pleased        Score:  1-7 Mild 8-19 Moderate 20-35 Severe    PMH: Past Medical History:  Diagnosis Date  . Allergic rhinitis   .  BPH with obstruction/lower urinary tract symptoms   . Chronic headaches   . Diabetes mellitus without complication (HCC)   . Diverticulosis   . Dyslipidemia   . Erectile dysfunction   . Fatty liver   . GERD (gastroesophageal reflux disease)   . Hypogonadism in male   . Personal history of multiple concussions   . Steatohepatitis, non-alcoholic     Surgical History: Past Surgical History:  Procedure Laterality Date  . LIVER BIOPSY  06/10/2009  . MYELOGRAM    . NASAL SINUS SURGERY      Home Medications:  Allergies as of 01/09/2020      Reactions   Codeine    Fenofibrate Other (See Comments)   Fish Oil    Other reaction(s): Other (See Comments) Dyspepsia, foul taste, loose stools - tolerates Lovaza   Niacin And Related    Prednisone    Headache   Tricor [fenofibrate]       Medication List       Accurate as of January 09, 2020 11:59 PM. If you have any questions, ask your nurse or doctor.        aspirin 81 MG tablet Take 81 mg by mouth daily.   atorvastatin 10 MG tablet Commonly known as: LIPITOR Take 10 mg by mouth daily.   cholecalciferol 10 MCG (400 UNIT) Tabs tablet Commonly known as: VITAMIN D3 Take 1,000 Units by mouth daily.   fenofibrate 160 MG tablet Take 160 mg by mouth daily.   lisinopril 5 MG tablet Commonly known as: ZESTRIL Take 5 mg by mouth daily.   propranolol 10 MG tablet Commonly known as: INDERAL Take by mouth.   sildenafil 20 MG tablet Commonly known as: REVATIO Take 3 to 5 tablets two hours before intercouse on an empty stomach.  Do not take with nitrates.   SUMAtriptan 25 MG tablet Commonly known as: IMITREX Take by mouth.   testosterone cypionate 200 MG/ML injection Commonly known as: DEPOTESTOSTERONE CYPIONATE INJECT 0.75 ML IN THE MUSCLE EVERY WEEK   Vascepa 1 g capsule Generic drug: icosapent Ethyl TK 2 CS PO BID   vitamin B-12 1000 MCG tablet Commonly known as: CYANOCOBALAMIN Take by mouth.   vitamin E 180 MG  (400 UNITS) capsule Take 800 Units by mouth daily.       Allergies:  Allergies  Allergen Reactions  . Codeine   . Fenofibrate  Other (See Comments)  . Fish Oil     Other reaction(s): Other (See Comments) Dyspepsia, foul taste, loose stools - tolerates Lovaza  . Niacin And Related   . Prednisone     Headache  . Tricor [Fenofibrate]     Family History: Family History  Problem Relation Age of Onset  . Hypertension Mother   . Alcohol abuse Mother   . Diabetes Mother   . Stroke Mother   . Transient ischemic attack Father   . Hyperlipidemia Father   . Melanoma Father   . Heart attack Father   . Kidney cancer Neg Hx   . Prostate cancer Neg Hx   . Bladder Cancer Neg Hx     Social History:  reports that he has never smoked. He has never used smokeless tobacco. He reports current alcohol use. He reports that he does not use drugs.  ROS: UROLOGY Frequent Urination?: No Hard to postpone urination?: No Burning/pain with urination?: No Get up at night to urinate?: Yes Leakage of urine?: No Urine stream starts and stops?: No Trouble starting stream?: No Do you have to strain to urinate?: No Blood in urine?: No Urinary tract infection?: No Sexually transmitted disease?: No Injury to kidneys or bladder?: No Painful intercourse?: No Weak stream?: No Erection problems?: No Penile pain?: No  Gastrointestinal Nausea?: No Vomiting?: No Indigestion/heartburn?: No Diarrhea?: No Constipation?: No  Constitutional Fever: No Night sweats?: No Weight loss?: No Fatigue?: No  Skin Skin rash/lesions?: No Itching?: No  Eyes Blurred vision?: No Double vision?: No  Ears/Nose/Throat Sore throat?: No Sinus problems?: No  Hematologic/Lymphatic Swollen glands?: No Easy bruising?: No  Cardiovascular Leg swelling?: No Chest pain?: No  Respiratory Cough?: No Shortness of breath?: No  Endocrine Excessive thirst?: No  Musculoskeletal Back pain?: No Joint pain?:  Yes  Neurological Headaches?: No Dizziness?: No  Psychologic Depression?: No Anxiety?: No  Physical Exam: BP 131/90   Pulse 79   Ht 5' 11.5" (1.816 m)   Wt 192 lb (87.1 kg)   BMI 26.41 kg/m   Constitutional:  Well nourished. Alert and oriented, No acute distress. HEENT: [place] AT, mask in place.  Trachea midline, no masses. Cardiovascular: No clubbing, cyanosis, or edema. Respiratory: Normal respiratory effort, no increased work of breathing. GI: Abdomen is soft, non tender, non distended, no abdominal masses. Liver and spleen not palpable.  No hernias appreciated.  Stool sample for occult testing is not indicated.   GU: No CVA tenderness.  No bladder fullness or masses.  Patient with circumcised phallus.   Urethral meatus is patent.  No penile discharge. No penile lesions or rashes. Scrotum without lesions, cysts, rashes and/or edema.  Testicles are located scrotally bilaterally. No masses are appreciated in the testicles. Left and right epididymis are normal. Rectal: Patient with  normal sphincter tone. Anus and perineum without scarring or rashes. No rectal masses are appreciated. Prostate is approximately 35 grams, no nodules are appreciated. Seminal vesicles are normal. Skin: No rashes, bruises or suspicious lesions. Lymph: No inguinal adenopathy. Neurologic: Grossly intact, no focal deficits, moving all 4 extremities. Psychiatric: Normal mood and affect.   Laboratory Data: Urinalysis Component     Latest Ref Rng & Units 01/09/2020  Color, Urine     YELLOW YELLOW  Appearance     CLEAR CLEAR  Specific Gravity, Urine     1.005 - 1.030 >1.030 (H)  pH     5.0 - 8.0 5.0  Glucose, UA     NEGATIVE mg/dL 938 (  A)  Hgb urine dipstick     NEGATIVE NEGATIVE  Bilirubin Urine     NEGATIVE NEGATIVE  Ketones, ur     NEGATIVE mg/dL NEGATIVE  Protein     NEGATIVE mg/dL NEGATIVE  Nitrite     NEGATIVE NEGATIVE  Leukocytes,Ua     NEGATIVE NEGATIVE  Squamous Epithelial / LPF     0  - 5 0-5  WBC, UA     0 - 5 WBC/hpf 0-5  RBC / HPF     0 - 5 RBC/hpf NONE SEEN  Bacteria, UA     NONE SEEN FEW (A)  Mucus      PRESENT  Hyaline Casts, UA      PRESENT    PSA History  0.22 ng/mL on 04/24/2017  0.3   ng/mL on 10/13/2016   Component     Latest Ref Rng & Units 02/05/2018 05/26/2018 06/30/2019 01/09/2020  Prostatic Specific Antigen     0.00 - 4.00 ng/mL 0.35 0.31 0.24 0.21    Lab Results  Component Value Date   WBC 5.2 04/24/2017   HGB 14.4 01/09/2020   HCT 45.3 01/09/2020   MCV 79.3 (L) 04/24/2017   PLT 159 04/24/2017    Lab Results  Component Value Date   TESTOSTERONE 500 01/09/2020  I have reviewed the labs.  Assessment & Plan:    1. History of hematuria (high risk) Hematuria work up completed in 07/2019 - findings positive for 2 mm right inferior pole of kidney No report of gross hematuria  UA today No RBC's seen RTC in one year for UA - patient to report any gross hematuria in the interim    2. Testosterone deficiency  Most recent testosterone level is 500 ng/dL on 01/09/2020 (goal 450-600 ng/dL) Continue testosterone cypionate 200 mg/cc, 0.5 cc q week - patient will continue self injection - refills given RTC in 6 months for HCT, HBG, testosterone, PSA and exam  3. BPH with LUTS IPSS score is 3/1, it is improved  Continue conservative management, avoiding bladder irritants and timed voiding's RTC in 6 months for IPSS, PSA and exam, as testosterone therapy can cause prostate enlargement and worsen LUTS  4. Erectile dysfunction:    SHIM score is 24, it is stable  Continue sildenafil 20 mg RTC in 6 months for SHIM score and exam, as testosterone therapy can affect erections  Return in 6 months (on 07/08/2020) for IPSS, SHIM and exam, PSA, testosterone (one week after injection) H & H.  These notes generated with voice recognition software. I apologize for typographical errors.  Zara Council, PA-C  [facility] [address] [place], [place] [address] [phone]

## 2020-01-09 ENCOUNTER — Ambulatory Visit: Payer: BC Managed Care – PPO | Admitting: Urology

## 2020-01-09 ENCOUNTER — Other Ambulatory Visit: Payer: Self-pay | Admitting: Family Medicine

## 2020-01-09 ENCOUNTER — Encounter: Payer: Self-pay | Admitting: Urology

## 2020-01-09 ENCOUNTER — Other Ambulatory Visit: Payer: Self-pay

## 2020-01-09 ENCOUNTER — Other Ambulatory Visit
Admission: RE | Admit: 2020-01-09 | Discharge: 2020-01-09 | Disposition: A | Discharge status: Home / Self Care | Payer: BC Managed Care – PPO | Attending: Urology | Admitting: Urology

## 2020-01-09 VITALS — BP 131/90 | HR 79 | Ht 71.5 in | Wt 192.0 lb

## 2020-01-09 DIAGNOSIS — E291 Testicular hypofunction: Secondary | ICD-10-CM

## 2020-01-09 DIAGNOSIS — E349 Endocrine disorder, unspecified: Secondary | ICD-10-CM | POA: Diagnosis not present

## 2020-01-09 DIAGNOSIS — N401 Enlarged prostate with lower urinary tract symptoms: Secondary | ICD-10-CM | POA: Diagnosis present

## 2020-01-09 DIAGNOSIS — N529 Male erectile dysfunction, unspecified: Secondary | ICD-10-CM | POA: Diagnosis not present

## 2020-01-09 DIAGNOSIS — N138 Other obstructive and reflux uropathy: Secondary | ICD-10-CM | POA: Diagnosis present

## 2020-01-09 DIAGNOSIS — R310 Gross hematuria: Secondary | ICD-10-CM | POA: Diagnosis not present

## 2020-01-09 DIAGNOSIS — Z87448 Personal history of other diseases of urinary system: Secondary | ICD-10-CM

## 2020-01-09 LAB — HEMOGLOBIN AND HEMATOCRIT, BLOOD
HCT: 45.3 % (ref 39.0–52.0)
Hemoglobin: 14.4 g/dL (ref 13.0–17.0)

## 2020-01-09 LAB — URINALYSIS, COMPLETE (UACMP) WITH MICROSCOPIC
Bilirubin Urine: NEGATIVE
Glucose, UA: 100 mg/dL — AB
Hgb urine dipstick: NEGATIVE
Ketones, ur: NEGATIVE mg/dL
Leukocytes,Ua: NEGATIVE
Nitrite: NEGATIVE
Protein, ur: NEGATIVE mg/dL
RBC / HPF: NONE SEEN RBC/hpf (ref 0–5)
Specific Gravity, Urine: >1.03 — ABNORMAL HIGH (ref 1.005–1.030)
pH: 5 (ref 5.0–8.0)

## 2020-01-09 LAB — PSA: Prostatic Specific Antigen: 0.21 ng/mL (ref 0.00–4.00)

## 2020-01-09 MED ORDER — TESTOSTERONE CYPIONATE 200 MG/ML IM SOLN: INTRAMUSCULAR | 1 refills | Status: DC

## 2020-01-10 LAB — TESTOSTERONE: Testosterone: 500 ng/dL (ref 264–916)

## 2020-06-16 ENCOUNTER — Other Ambulatory Visit: Payer: Self-pay | Admitting: Urology

## 2020-06-16 DIAGNOSIS — E291 Testicular hypofunction: Secondary | ICD-10-CM

## 2020-06-20 ENCOUNTER — Telehealth: Payer: Self-pay | Admitting: Family Medicine

## 2020-06-20 NOTE — Telephone Encounter (Signed)
PA approved for Testosterone Dates:  06/19/2020-06/20/2023

## 2020-07-08 IMAGING — CT CT ABDOMEN AND PELVIS WITHOUT AND WITH CONTRAST
2 of 6 series · 13 of 32 positions shown, 18 images · IV contrast (APPLIED)
Comparison: None.

CLINICAL DATA: Patient with history of renal stones. Gross
hematuria.

EXAM:
CT ABDOMEN AND PELVIS WITHOUT AND WITH CONTRAST
TECHNIQUE: Multidetector CT imaging of the abdomen and pelvis was performed
following the standard protocol before and following the bolus
administration of intravenous contrast.
CONTRAST:  125mL OMNIPAQUE IOHEXOL 300 MG/ML  SOLN

[Series 6: axial post · axial · 0.80mm/px · z∈[-516,-86]mm · 7 of 116 slices shown, 12 images]
[im 15/116  soft-tissue]
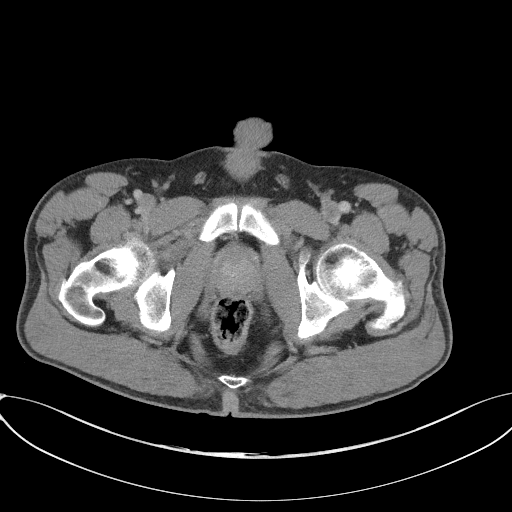
[im 15/116  bone]
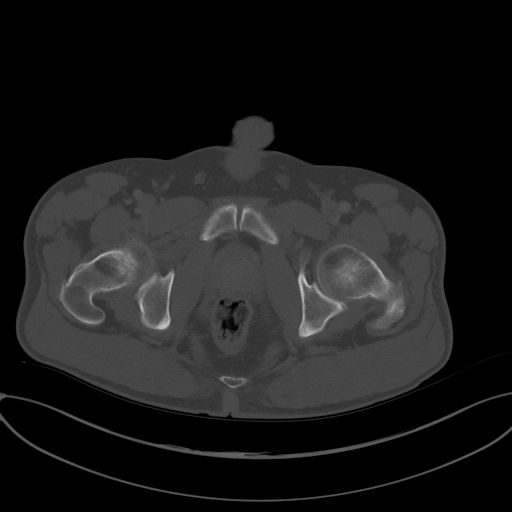
[im 29/116  soft-tissue]
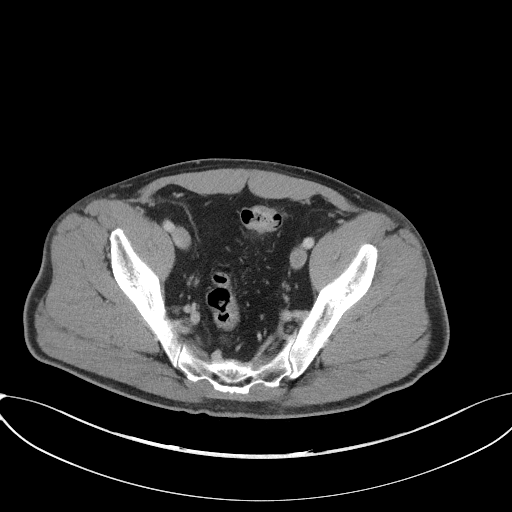
[im 44/116  soft-tissue]
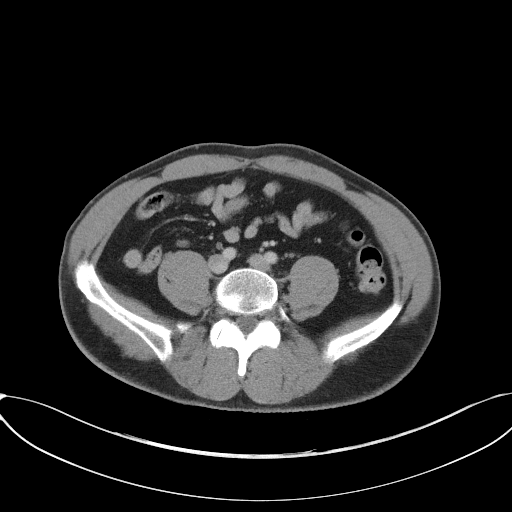
[im 58/116  soft-tissue]
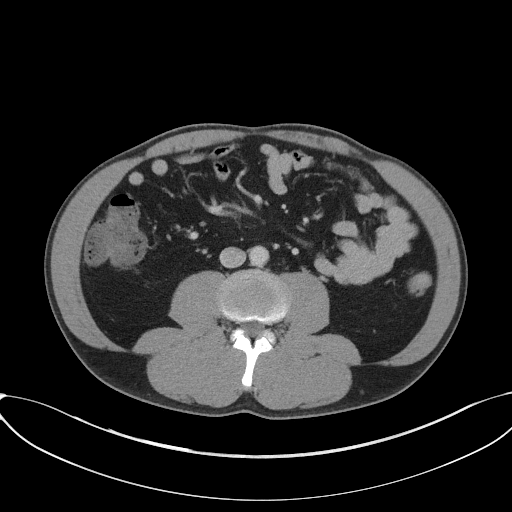
[im 58/116  lung]
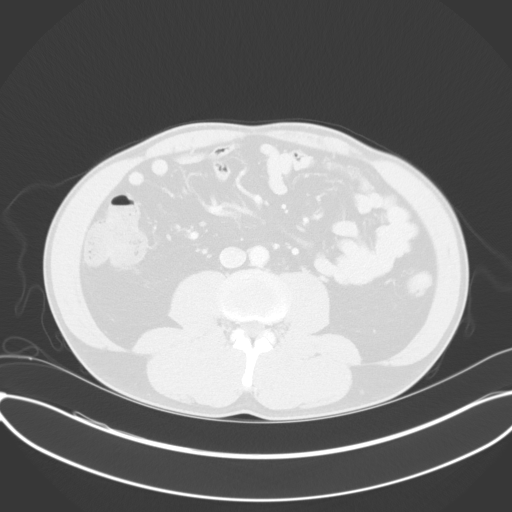
[im 72/116  soft-tissue]
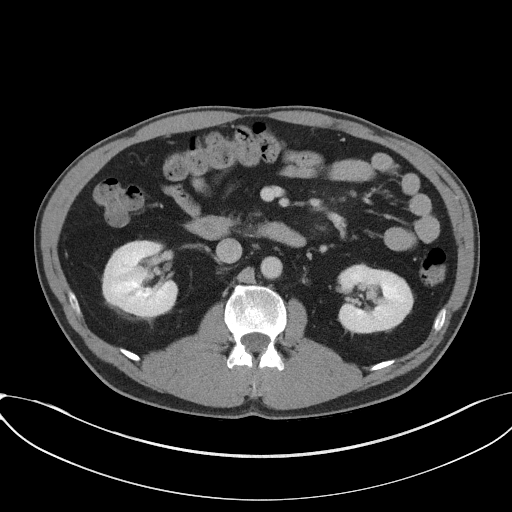
[im 72/116  lung]
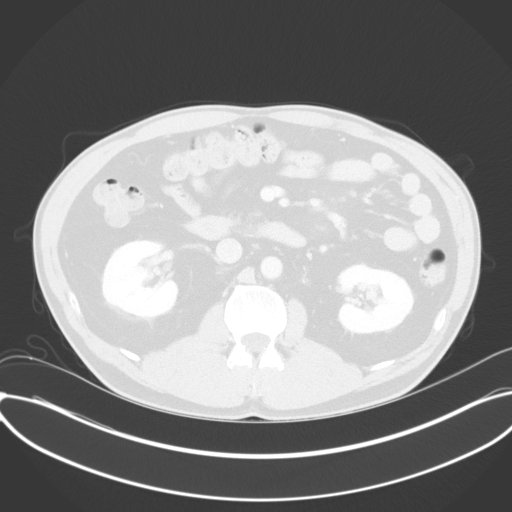
[im 87/116  soft-tissue]
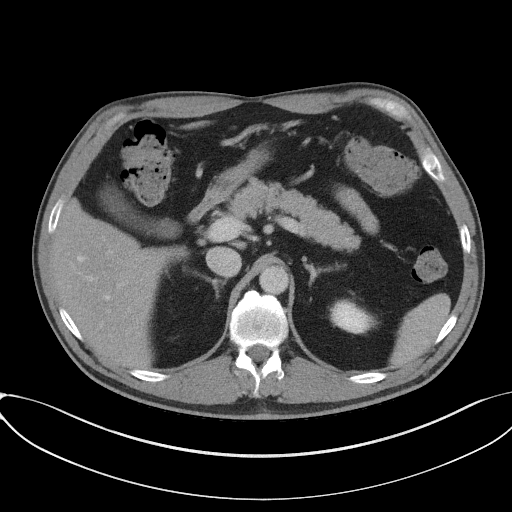
[im 87/116  lung]
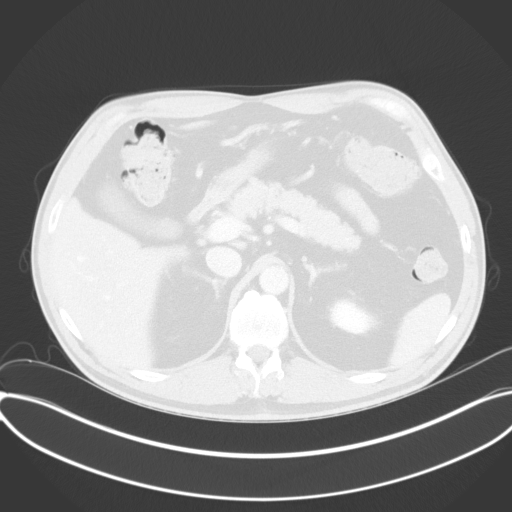
[im 101/116  soft-tissue]
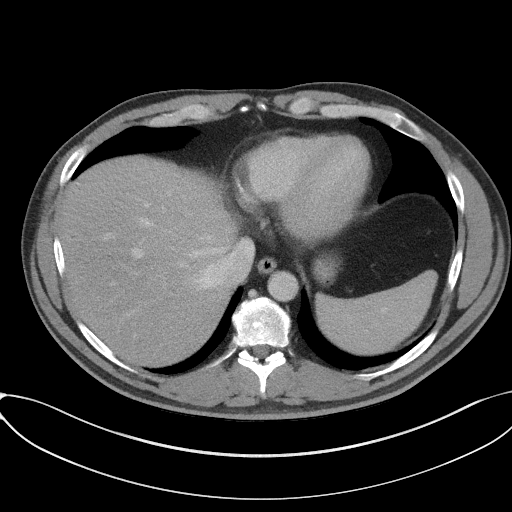
[im 101/116  lung]
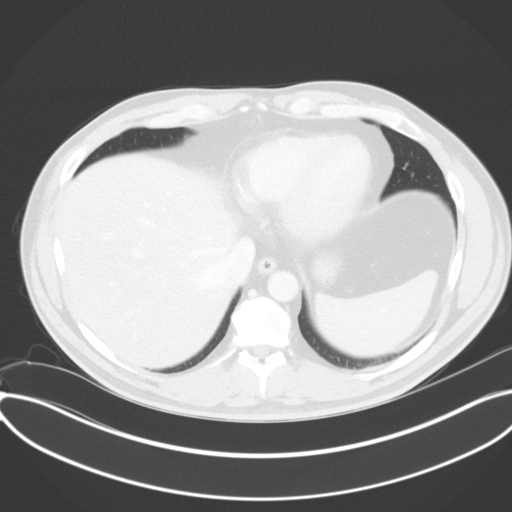

[Series 9: axial delay · axial · delayed · 0.79mm/px · z∈[-422,-56]mm · 6 of 117 slices shown]
[im 15/117  soft-tissue]
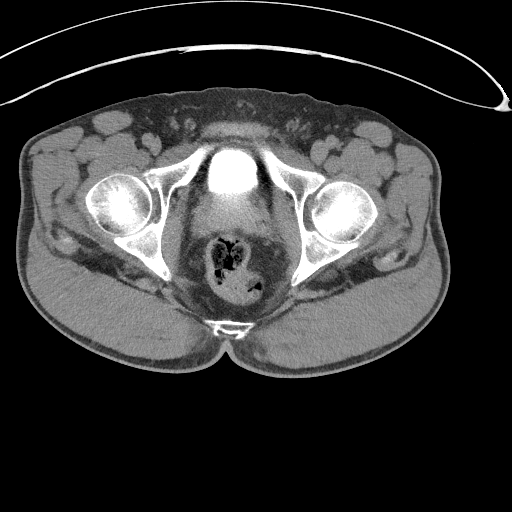
[im 30/117  soft-tissue]
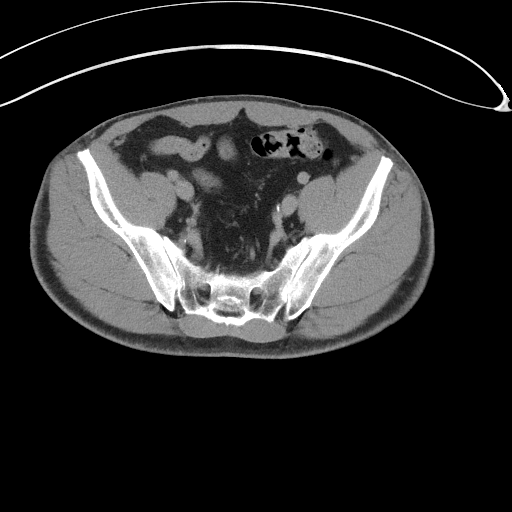
[im 44/117  soft-tissue]
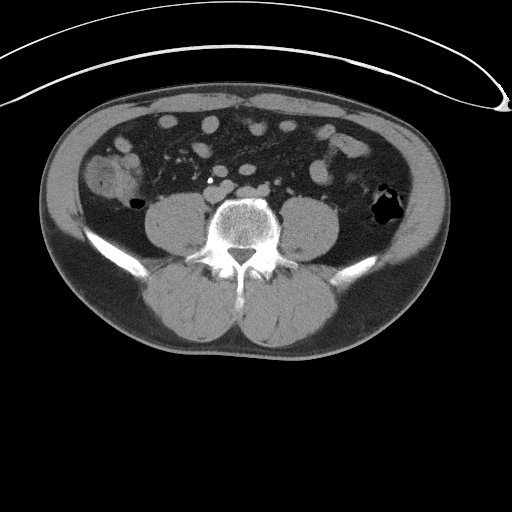
[im 59/117  soft-tissue]
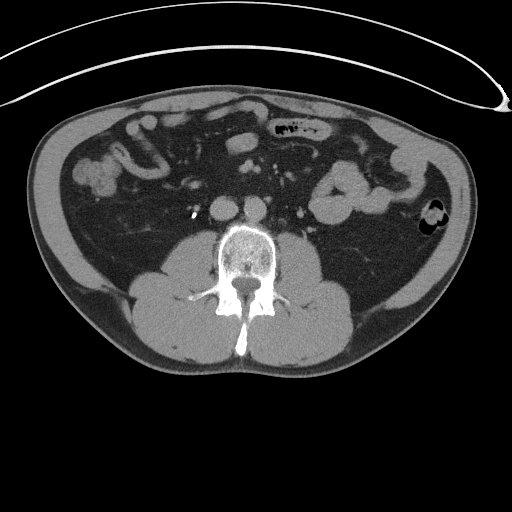
[im 73/117  soft-tissue]
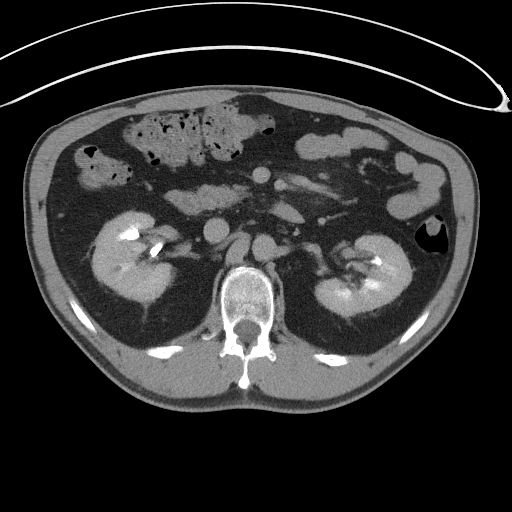
[im 88/117  soft-tissue]
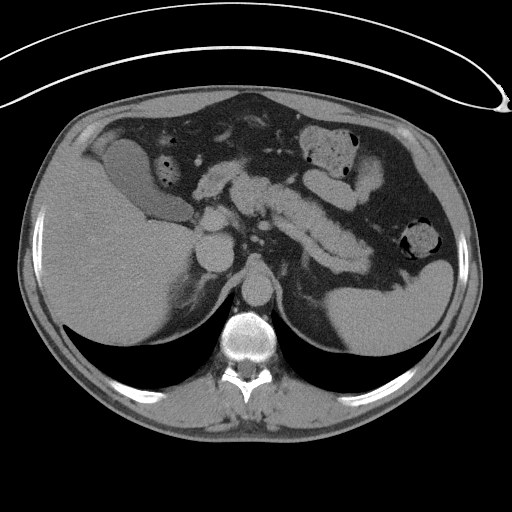

[13 of 32 positions shown; findings below may reference images not displayed]

FINDINGS: Lower chest: Normal heart size. 4 mm right middle lobe nodule (image
1; series 6). Dependent atelectasis within the bilateral lower
lobes. 4 mm left lower lobe nodule (image 4; series 6).

Hepatobiliary: Liver is diffusely low in attenuation. No focal
hepatic lesion identified. Gallbladder is unremarkable. No
intrahepatic or extrahepatic biliary ductal dilatation.

Pancreas: Unremarkable

Spleen: Unremarkable

Adrenals/Urinary Tract: Normal adrenal glands. Noncontrast images
demonstrate a punctate 1-2 mm stone within the inferior pole of the
right kidney (image 51; series 4). No ureterolithiasis. No
hydronephrosis. Kidneys enhance symmetrically with contrast. No
suspicious enhancing renal masses identified. Urinary bladder is
decompressed. Subcentimeter too small to characterize partially
exophytic low-attenuation lesion superior pole right kidney (image
60; series 12). Additional bilateral subcentimeter too small to
characterize low-attenuation lesions are demonstrated. Delayed
images demonstrate excreted contrast material into the bilateral
renal collecting systems and ureters. No abnormal filling defects
identified within the renal collecting systems and opacified
portions of the ureters. The distal right ureter and proximal/mid
left ureter are not well opacified.

Stomach/Bowel: No abnormal bowel wall thickening or evidence for
bowel obstruction. No free fluid or free intraperitoneal air. Normal
morphology of the stomach.

Vascular/Lymphatic: Normal caliber abdominal aorta. Peripheral
calcified atherosclerotic plaque. No retroperitoneal
lymphadenopathy.

Reproductive: Heterogeneous prostate.

Other: None.

Musculoskeletal: No aggressive or acute appearing osseous lesions.
Lower thoracic and lumbar spine degenerative changes.
IMPRESSION: 1. Punctate 1-2 mm stone inferior pole right kidney.
2. No ureterolithiasis.  No hydronephrosis.
3. No suspicious enhancing renal masses.
4. No abnormal filling defects within the opacified renal collecting
systems and ureters.
5. Pulmonary nodules measuring up to 4 mm. No follow-up needed if
patient is low-risk (and has no known or suspected primary
neoplasm). Non-contrast chest CT can be considered in 12 months if
patient is high-risk. This recommendation follows the consensus
statement: Guidelines for Management of Incidental Pulmonary Nodules
Detected on CT Images: From the [HOSPITAL] 5576; Radiology
6. Hepatic steatosis.

## 2020-07-13 ENCOUNTER — Other Ambulatory Visit
Admission: RE | Admit: 2020-07-13 | Discharge: 2020-07-13 | Disposition: A | Discharge status: Home / Self Care | Payer: BC Managed Care – PPO | Attending: Urology | Admitting: Urology

## 2020-07-13 ENCOUNTER — Other Ambulatory Visit: Payer: Self-pay

## 2020-07-13 DIAGNOSIS — N138 Other obstructive and reflux uropathy: Secondary | ICD-10-CM

## 2020-07-13 DIAGNOSIS — E291 Testicular hypofunction: Secondary | ICD-10-CM | POA: Diagnosis present

## 2020-07-13 DIAGNOSIS — N401 Enlarged prostate with lower urinary tract symptoms: Secondary | ICD-10-CM | POA: Diagnosis present

## 2020-07-13 LAB — HEMOGLOBIN AND HEMATOCRIT, BLOOD
HCT: 41.5 % (ref 39.0–52.0)
Hemoglobin: 13 g/dL (ref 13.0–17.0)

## 2020-07-13 LAB — PSA: Prostatic Specific Antigen: 0.24 ng/mL (ref 0.00–4.00)

## 2020-07-14 LAB — TESTOSTERONE: Testosterone: 682 ng/dL (ref 264–916)

## 2020-07-15 NOTE — Progress Notes (Signed)
07/16/2020 1:16 PM   Jeffrey Cook. 07-21-1966 [id number]  Referring provider: Orene Desanctis, MD [address] [place],  [place] [address]  Chief Complaint  Patient presents with  . Follow-up    SHIM, IPSS    HPI: Jeffrey Frix. is a 54 y.o. male with a history of hematuria, testosterone deficiency, erectile dysfunction and BPH with LUTS who presents today for a 6 month follow-up.  History of hematuria (high risk) Non-smoker.  CTU 07/2019 Punctate 1-2 mm stone inferior pole right kidney.  No ureterolithiasis.  No hydronephrosis.  No suspicious enhancing renal masses.  No abnormal filling defects within the opacified renal collecting systems and ureters.  Pulmonary nodules measuring up to 4 mm. No follow-up needed if patient is low-risk (and has no known or suspected primary neoplasm). Non-contrast chest CT can be considered in 12 months if patient is high-risk. Hepatic steatosis.  Cysto with Dr. Apolinar Junes 07/2019 NED.  He has not had any gross hematuria.    Testosterone deficiency He is still having spontaneous erections at night.   He does not have sleep apnea.   He has type 2 diabetes mellitus.  He is currently managing his testosterone deficiency with testosterone cypionate 200 mg/cc, 0.5 cc q weekly.    Component     Latest Ref Rng & Units 07/13/2020  Testosterone     264 - 916 ng/dL 947   Component     Latest Ref Rng & Units 07/13/2020  Hemoglobin     13.0 - 17.0 g/dL 09.6  HCT     39 - 52 % 41.5     Erectile dysfunction His SHIM score is 24, which is no ED.   His previous SHIM score was 24.   His libido is preserved   His risk factors for ED are age, BPH, testosterone deficiency, DM, HTN and HLD.  He denies any painful erections or curvatures with his erections.   He is still spontaneous erections.   Takes sildenafil.    SHIM    Row Name 07/16/20 1310         SHIM: Over the last 6 months:   How do you rate your confidence that you could get and keep an  erection? High     When you had erections with sexual stimulation, how often were your erections hard enough for penetration (entering your partner)? Almost Always or Always     During sexual intercourse, how often were you able to maintain your erection after you had penetrated (entered) your partner? Almost Always or Always     During sexual intercourse, how difficult was it to maintain your erection to completion of intercourse? Not Difficult     When you attempted sexual intercourse, how often was it satisfactory for you? Almost Always or Always       SHIM Total Score   SHIM 24            Score: 1-7 Severe ED 8-11 Moderate ED 12-16 Mild-Moderate ED 17-21 Mild ED 22-25 No ED   BPH WITH LUTS  (prostate and/or bladder) IPSS score: 3/2    Previous score: 3/1    Major complaint(s):  Urgency.  Denies any dysuria, hematuria or suprapubic pain.    Denies any recent fevers, chills, nausea or vomiting.  He does not have a family history of PCa.   IPSS    Row Name 07/16/20 1300         International Prostate Symptom Score   How  often have you had the sensation of not emptying your bladder? Not at All     How often have you had to urinate less than every two hours? Less than 1 in 5 times     How often have you found you stopped and started again several times when you urinated? Not at All     How often have you found it difficult to postpone urination? Less than 1 in 5 times     How often have you had a weak urinary stream? Not at All     How often have you had to strain to start urination? Not at All     How many times did you typically get up at night to urinate? 1 Time     Total IPSS Score 3       Quality of Life due to urinary symptoms   If you were to spend the rest of your life with your urinary condition just the way it is now how would you feel about that? Mostly Satisfied            Score:  1-7 Mild 8-19 Moderate 20-35 Severe    PMH: Past Medical History:   Diagnosis Date  . Allergic rhinitis   . BPH with obstruction/lower urinary tract symptoms   . Chronic headaches   . Diabetes mellitus without complication (HCC)   . Diverticulosis   . Dyslipidemia   . Erectile dysfunction   . Fatty liver   . GERD (gastroesophageal reflux disease)   . Hypogonadism in male   . Personal history of multiple concussions   . Steatohepatitis, non-alcoholic     Surgical History: Past Surgical History:  Procedure Laterality Date  . LIVER BIOPSY  06/10/2009  . MYELOGRAM    . NASAL SINUS SURGERY      Home Medications:  Allergies as of 07/16/2020      Reactions   Codeine    Fenofibrate Other (See Comments)   Fish Oil    Other reaction(s): Other (See Comments) Dyspepsia, foul taste, loose stools - tolerates Lovaza   Niacin And Related    Prednisone    Headache   Tricor [fenofibrate]       Medication List       Accurate as of July 16, 2020  1:16 PM. If you have any questions, ask your nurse or doctor.        aspirin 81 MG tablet Take 81 mg by mouth daily.   atorvastatin 10 MG tablet Commonly known as: LIPITOR Take 10 mg by mouth daily.   cholecalciferol 10 MCG (400 UNIT) Tabs tablet Commonly known as: VITAMIN D3 Take 1,000 Units by mouth daily.   fenofibrate 160 MG tablet Take 160 mg by mouth daily.   lisinopril 5 MG tablet Commonly known as: ZESTRIL Take 5 mg by mouth daily.   propranolol 10 MG tablet Commonly known as: INDERAL Take by mouth.   sildenafil 20 MG tablet Commonly known as: REVATIO Take 3 to 5 tablets two hours before intercouse on an empty stomach.  Do not take with nitrates.   SUMAtriptan 25 MG tablet Commonly known as: IMITREX Take by mouth.   testosterone cypionate 200 MG/ML injection Commonly known as: DEPOTESTOSTERONE CYPIONATE ADMINISTER 0.5 ML IN THE MUSCLE EVERY WEEK   Vascepa 1 g capsule Generic drug: icosapent Ethyl TK 2 CS PO BID   vitamin B-12 1000 MCG tablet Commonly known as:  CYANOCOBALAMIN Take by mouth.   vitamin E 180 MG (400  UNITS) capsule Take 800 Units by mouth daily.       Allergies:  Allergies  Allergen Reactions  . Codeine   . Fenofibrate Other (See Comments)  . Fish Oil     Other reaction(s): Other (See Comments) Dyspepsia, foul taste, loose stools - tolerates Lovaza  . Niacin And Related   . Prednisone     Headache  . Tricor [Fenofibrate]     Family History: Family History  Problem Relation Age of Onset  . Hypertension Mother   . Alcohol abuse Mother   . Diabetes Mother   . Stroke Mother   . Transient ischemic attack Father   . Hyperlipidemia Father   . Melanoma Father   . Heart attack Father   . Kidney cancer Neg Hx   . Prostate cancer Neg Hx   . Bladder Cancer Neg Hx     Social History:  reports that he has never smoked. He has never used smokeless tobacco. He reports current alcohol use. He reports that he does not use drugs.  ROS: For pertinent review of systems please refer to history of present illness  Physical Exam: BP 114/78   Pulse 87   Ht 5\' 11"  (1.803 m)   Wt 200 lb (90.7 kg)   BMI 27.89 kg/m   Constitutional:  Well nourished. Alert and oriented, No acute distress. HEENT: [place] AT, mask in place.  Trachea midline Cardiovascular: No clubbing, cyanosis, or edema. Respiratory: Normal respiratory effort, no increased work of breathing. GI: Abdomen is soft, non tender, non distended, no abdominal masses. Liver and spleen not palpable.  No hernias appreciated.  Stool sample for occult testing is not indicated.   GU: No CVA tenderness.  No bladder fullness or masses.  Patient with uncircumcised phallus.  Foreskin easily retracted  Urethral meatus is patent.  No penile discharge. No penile lesions or rashes. Scrotum without lesions, cysts, rashes and/or edema.  Testicles are located scrotally bilaterally. No masses are appreciated in the testicles. Left and right epididymis are normal. Rectal: Patient with  normal  sphincter tone. Anus and perineum without scarring or rashes. No rectal masses are appreciated. Prostate is approximately 45 grams, no nodules are appreciated. Seminal vesicles are normal. Skin: No rashes, bruises or suspicious lesions. Lymph: No inguinal adenopathy. Neurologic: Grossly intact, no focal deficits, moving all 4 extremities. Psychiatric: Normal mood and affect.  Laboratory Data: Urinalysis Component     Latest Ref Rng & Units 01/09/2020  Color, Urine     YELLOW YELLOW  Appearance     CLEAR CLEAR  Specific Gravity, Urine     1.005 - 1.030 >1.030 (H)  pH     5.0 - 8.0 5.0  Glucose, UA     NEGATIVE mg/dL 01/11/2020 (A)  Hgb urine dipstick     NEGATIVE NEGATIVE  Bilirubin Urine     NEGATIVE NEGATIVE  Ketones, ur     NEGATIVE mg/dL NEGATIVE  Protein     NEGATIVE mg/dL NEGATIVE  Nitrite     NEGATIVE NEGATIVE  Leukocytes,Ua     NEGATIVE NEGATIVE  Squamous Epithelial / LPF     0 - 5 0-5  WBC, UA     0 - 5 WBC/hpf 0-5  RBC / HPF     0 - 5 RBC/hpf NONE SEEN  Bacteria, UA     NONE SEEN FEW (A)  Mucus      PRESENT  Hyaline Casts, UA      PRESENT    PSA History  0.22 ng/mL on 04/24/2017  0.3   ng/mL on 10/13/2016     Component     Latest Ref Rng & Units 02/05/2018 05/26/2018 06/30/2019 01/09/2020  Prostatic Specific Antigen     0.00 - 4.00 ng/mL 0.35 0.31 0.24 0.21   Component     Latest Ref Rng & Units 07/13/2020  Prostatic Specific Antigen     0.00 - 4.00 ng/mL 0.24   Lab Results  Component Value Date   WBC 5.2 04/24/2017   HGB 13.0 07/13/2020   HCT 41.5 07/13/2020   MCV 79.3 (L) 04/24/2017   PLT 159 04/24/2017    Lab Results  Component Value Date   TESTOSTERONE 682 07/13/2020  I have reviewed the labs.  Assessment & Plan:    1. Testosterone deficiency  Testosterone level is therapeutic  (goal 450-600 ng/dL) Continue testosterone cypionate 200 mg/cc, 0.5 cc q week - patient will continue self injection  RTC in 6 months for HCT, HBG, testosterone, PSA  and exam  2. BPH with LUTS IPSS score is 3/2, it is slightly worse  Continue conservative management, avoiding bladder irritants and timed voiding's RTC in 6 months for IPSS, PSA and exam, as testosterone therapy can cause prostate enlargement and worsen LUTS  3. Erectile dysfunction:    SHIM score is 24, it is stable  Continue sildenafil 20 mg RTC in 6 months for SHIM score and exam, as testosterone therapy can affect erections  4. History of hematuria (high risk) Hematuria work up completed in 07/2019 - findings positive for 2 mm right inferior pole of kidney No report of gross hematuria  UA 12/2019 No RBC's seen RTC in one year for UA - patient to report any gross hematuria in the interim     Return in about 6 months (around 01/16/2021) for IPSS, SHIM and exam, PSA, UA, testosterone (one week after injection) H & H.  These notes generated with voice recognition software. I apologize for typographical errors.  Jeffrey CowboySHANNON Derrien Anschutz, PA-C  [facility] [address] [place], [place] [id number] [phone]

## 2020-07-16 ENCOUNTER — Encounter: Payer: Self-pay | Admitting: Urology

## 2020-07-16 ENCOUNTER — Other Ambulatory Visit: Payer: Self-pay

## 2020-07-16 ENCOUNTER — Ambulatory Visit: Payer: BC Managed Care – PPO | Admitting: Urology

## 2020-07-16 VITALS — BP 114/78 | HR 87 | Ht 71.0 in | Wt 200.0 lb

## 2020-07-16 DIAGNOSIS — Z87448 Personal history of other diseases of urinary system: Secondary | ICD-10-CM

## 2020-07-16 DIAGNOSIS — N138 Other obstructive and reflux uropathy: Secondary | ICD-10-CM

## 2020-07-16 DIAGNOSIS — E349 Endocrine disorder, unspecified: Secondary | ICD-10-CM

## 2020-07-16 DIAGNOSIS — N529 Male erectile dysfunction, unspecified: Secondary | ICD-10-CM

## 2020-07-16 DIAGNOSIS — N401 Enlarged prostate with lower urinary tract symptoms: Secondary | ICD-10-CM | POA: Diagnosis not present

## 2020-07-16 MED ORDER — SILDENAFIL CITRATE 20 MG PO TABS: ORAL_TABLET | ORAL | 3 refills | Status: DC

## 2020-12-11 ENCOUNTER — Other Ambulatory Visit: Payer: Self-pay | Admitting: *Deleted

## 2020-12-11 DIAGNOSIS — E291 Testicular hypofunction: Secondary | ICD-10-CM

## 2020-12-13 MED ORDER — TESTOSTERONE CYPIONATE 200 MG/ML IM SOLN: INTRAMUSCULAR | 0 refills | Status: DC

## 2021-01-11 ENCOUNTER — Other Ambulatory Visit: Payer: Self-pay

## 2021-01-11 ENCOUNTER — Other Ambulatory Visit
Admission: RE | Admit: 2021-01-11 | Discharge: 2021-01-11 | Disposition: A | Discharge status: Home / Self Care | Payer: BC Managed Care – PPO | Attending: Urology | Admitting: Urology

## 2021-01-11 DIAGNOSIS — E349 Endocrine disorder, unspecified: Secondary | ICD-10-CM | POA: Diagnosis not present

## 2021-01-11 DIAGNOSIS — N138 Other obstructive and reflux uropathy: Secondary | ICD-10-CM | POA: Insufficient documentation

## 2021-01-11 DIAGNOSIS — N401 Enlarged prostate with lower urinary tract symptoms: Secondary | ICD-10-CM | POA: Insufficient documentation

## 2021-01-11 LAB — HEMATOCRIT: HCT: 43.6 % (ref 39.0–52.0)

## 2021-01-11 LAB — HEMOGLOBIN: Hemoglobin: 13.6 g/dL (ref 13.0–17.0)

## 2021-01-11 LAB — PSA: Prostatic Specific Antigen: 0.24 ng/mL (ref 0.00–4.00)

## 2021-01-12 LAB — TESTOSTERONE: Testosterone: 629 ng/dL (ref 264–916)

## 2021-01-13 NOTE — Progress Notes (Incomplete)
01/14/2021 11:35 PM   Jeffrey Cook. 18-May-1966 [id number]  Referring provider: Barbaraann Boys, MD [address] [place],  [place] [address]  No chief complaint on file.  Urological history 1. High risk hematuria - Non-smoker.  - CTU 07/2019 Punctate 1-2 mm stone inferior pole right kidney.  No ureterolithiasis.  No hydronephrosis.  No suspicious enhancing renal masses.  No abnormal filling defects within the opacified renal collecting systems and ureters.  Pulmonary nodules measuring up to 4 mm. No follow-up needed if patient is low-risk (and has no known or suspected primary neoplasm). Non-contrast chest CT can be considered in 12 months if patient is high-risk. Hepatic steatosis.   - Cysto with Dr. Erlene Quan 07/2019 NED.  - UA ***  2. Testosterone deficiency - managing with testosterone cypionate 200 mg/cc, 0.5 cc q weekly - testosterone level 629 ng/dL in 12/2020  3. ED - SHIM *** - contributing factors of age, BPH, testosterone deficiency, DM, HTN and HLD - managing with sildenafil 20 mg on demand dosing  4. BPH with LU TS - PSA 0.24 in 12/2020 - I PSS ***        HPI: Jeffrey Cook. is a 55 y.o. male who presents today for a 6 month follow-up.       Score: 1-7 Severe ED 8-11 Moderate ED 12-16 Mild-Moderate ED 17-21 Mild ED 22-25 No ED      Score:  1-7 Mild 8-19 Moderate 20-35 Severe    PMH: Past Medical History:  Diagnosis Date  . Allergic rhinitis   . BPH with obstruction/lower urinary tract symptoms   . Chronic headaches   . Diabetes mellitus without complication ([place])   . Diverticulosis   . Dyslipidemia   . Erectile dysfunction   . Fatty liver   . GERD (gastroesophageal reflux disease)   . Hypogonadism in male   . Personal history of multiple concussions   . Steatohepatitis, non-alcoholic     Surgical History: Past Surgical History:  Procedure Laterality Date  . LIVER BIOPSY  06/10/2009  . MYELOGRAM    . NASAL SINUS  SURGERY      Home Medications:  Allergies as of 01/14/2021      Reactions   Codeine    Fenofibrate Other (See Comments)   Fish Oil    Other reaction(s): Other (See Comments) Dyspepsia, foul taste, loose stools - tolerates Lovaza   Niacin And Related    Prednisone    Headache   Tricor [fenofibrate]       Medication List       Accurate as of January 13, 2021 11:35 PM. If you have any questions, ask your nurse or doctor.        aspirin 81 MG tablet Take 81 mg by mouth daily.   atorvastatin 10 MG tablet Commonly known as: LIPITOR Take 10 mg by mouth daily.   cholecalciferol 10 MCG (400 UNIT) Tabs tablet Commonly known as: VITAMIN D3 Take 1,000 Units by mouth daily.   fenofibrate 160 MG tablet Take 160 mg by mouth daily.   lisinopril 5 MG tablet Commonly known as: ZESTRIL Take 5 mg by mouth daily.   propranolol 10 MG tablet Commonly known as: INDERAL Take by mouth.   sildenafil 20 MG tablet Commonly known as: REVATIO Take 3 to 5 tablets two hours before intercouse on an empty stomach.  Do not take with nitrates.   SUMAtriptan 25 MG tablet Commonly known as: IMITREX Take by mouth.   testosterone cypionate 200  MG/ML injection Commonly known as: DEPOTESTOSTERONE CYPIONATE ADMINISTER 0.5 ML IN THE MUSCLE EVERY WEEK   Vascepa 1 g capsule Generic drug: icosapent Ethyl TK 2 CS PO BID   vitamin B-12 1000 MCG tablet Commonly known as: CYANOCOBALAMIN Take by mouth.   vitamin E 180 MG (400 UNITS) capsule Take 800 Units by mouth daily.       Allergies:  Allergies  Allergen Reactions  . Codeine   . Fenofibrate Other (See Comments)  . Fish Oil     Other reaction(s): Other (See Comments) Dyspepsia, foul taste, loose stools - tolerates Lovaza  . Niacin And Related   . Prednisone     Headache  . Tricor [Fenofibrate]     Family History: Family History  Problem Relation Age of Onset  . Hypertension Mother   . Alcohol abuse Mother   . Diabetes Mother    . Stroke Mother   . Transient ischemic attack Father   . Hyperlipidemia Father   . Melanoma Father   . Heart attack Father   . Kidney cancer Neg Hx   . Prostate cancer Neg Hx   . Bladder Cancer Neg Hx     Social History:  reports that he has never smoked. He has never used smokeless tobacco. He reports current alcohol use. He reports that he does not use drugs.  ROS: For pertinent review of systems please refer to history of present illness  Physical Exam: There were no vitals taken for this visit.  Constitutional:  Well nourished. Alert and oriented, No acute distress. HEENT: [place] AT, moist mucus membranes.  Trachea midline Cardiovascular: No clubbing, cyanosis, or edema. Respiratory: Normal respiratory effort, no increased work of breathing. GI: Abdomen is soft, non tender, non distended, no abdominal masses. Liver and spleen not palpable.  No hernias appreciated.  Stool sample for occult testing is not indicated.   GU: No CVA tenderness.  No bladder fullness or masses.  Patient with circumcised/uncircumcised phallus. ***Foreskin easily retracted***  Urethral meatus is patent.  No penile discharge. No penile lesions or rashes. Scrotum without lesions, cysts, rashes and/or edema.  Testicles are located scrotally bilaterally. No masses are appreciated in the testicles. Left and right epididymis are normal. Rectal: Patient with  normal sphincter tone. Anus and perineum without scarring or rashes. No rectal masses are appreciated. Prostate is approximately *** grams, *** nodules are appreciated. Seminal vesicles are normal. Skin: No rashes, bruises or suspicious lesions. Lymph: No inguinal adenopathy. Neurologic: Grossly intact, no focal deficits, moving all 4 extremities. Psychiatric: Normal mood and affect.  Laboratory Data: Specimen:  Blood  Ref Range & Units 1 mo ago  Uric Acid 4.8 - 8.7 mg/dL 3.8Low   Resulting Agency  [facility]  Specimen Collected:  11/20/20 10:54 AM Last Resulted: 11/20/20 11:47 AM  Received From: [facility]  Result Received: 12/11/20 9:33 AM   Specimen:  Blood  Ref Range & Units 2 mo ago  Hemoglobin A1C <6.5 % 7.7High   Average Blood Glucose (Calculated From HgBA1c Level) mg/dL 178   Resulting Agency  [facility]   Narrative HBA1C results between 5.7% and 6.4% are suggestive of prediabetes  HBA1C result greater than or equal to 6.5% are diagnostic of diabetes Specimen Collected: 11/07/20 8:07 AM Last Resulted: 11/07/20 4:42 PM  Received From: [facility]  Result Received: 12/11/20 9:33 AM   Specimen:  Blood  Ref Range & Units 2 mo ago Comments  Sodium 135 -  145 mmol/L 135    Potassium 3.5 - 5.0 mmol/L 4.2    Chloride 98 - 108 mmol/L 103    Carbon Dioxide (CO2) 21 - 30 mmol/L 24    Urea Nitrogen (BUN) 7 - 20 mg/dL 15    Creatinine 0.6 - 1.3 mg/dL 1.1    Glucose 70 - 140 mg/dL 158High  Interpretive Data:  Above is the NONFASTING reference range.   Below are the FASTING reference ranges:  NORMAL:   70-99 mg/dL  PREDIABETES: 100-125 mg/dL  DIABETES:  > 125 mg/dL   Calcium 8.7 - 10.2 mg/dL 8.9    AST (Aspartate Aminotransferase) 15 - 41 U/L 25    ALT (Alanine Aminotransferase) 17 - 63 U/L 29    Bilirubin, Total 0.4 - 1.5 mg/dL 0.5    Alk Phos (Alkaline Phosphatase) 24 - 110 U/L 49    Albumin 3.5 - 4.8 g/dL 4.0    Protein, Total 6.2 - 8.1 g/dL 6.5    Anion Gap 3 - 12 mmol/L 8    BUN/CREA Ratio 6 - 27 14    Glomerular Filtration Rate (eGFR)  mL/min/1.73sq m 75   NON-Modified eGFR: 75 mL/min/1.73sq m .  We recommend using this value for referral decisions.   Modified eGFR : 87 mL/min/1.73sq m .  This eGFR estimates kidney function using the CKD-Epi equation that increases eGFR by 16% for patients identified as Black/African-American in the medical record. This value was previously reported as the single eGFR before 06/05/2020.    Interpretive Ranges for eGFR(CKD-EPI):   eGFR:       > 60 mL/min/1.73 sq m - Normal  eGFR:       30 - 59 mL/min/1.73 sq m - Moderately Decreased  eGFR:       15 - 29 mL/min/1.73 sq m - Severely Decreased  eGFR:       < 15 mL/min/1.73 sq m - Kidney Failure   Note: These eGFR calculations do not apply in acute situations  when eGFR is changing rapidly or in patients on dialysis.   Resulting Agency  [facility]   Specimen Collected: 11/07/20 8:07 AM Last Resulted: 11/07/20 4:27 PM  Received From: [facility]  Result Received: 12/11/20 9:33 AM    Urinalysis ***  PSA History  0.22 ng/mL on 04/24/2017  0.3   ng/mL on 10/13/2016   Component     Latest Ref Rng & Units 02/05/2018 05/26/2018 06/30/2019 01/09/2020  Prostatic Specific Antigen     0.00 - 4.00 ng/mL 0.35 0.31 0.24 0.21   Component     Latest Ref Rng & Units 07/13/2020 01/11/2021  Prostatic Specific Antigen     0.00 - 4.00 ng/mL 0.24 0.24   Lab Results  Component Value Date   WBC 5.2 04/24/2017   HGB 13.6 01/11/2021   HCT 43.6 01/11/2021   MCV 79.3 (L) 04/24/2017   PLT 159 04/24/2017    Lab Results  Component Value Date   TESTOSTERONE 629 01/11/2021  I have reviewed the labs.  Assessment & Plan:    1. Testosterone deficiency  Testosterone level is therapeutic  (goal 450-600 ng/dL) Continue testosterone cypionate 200 mg/cc, 0.5 cc q week - patient will continue self injection  RTC in 6 months for HCT, HBG, testosterone, PSA and exam  2. BPH with LUTS IPSS score is 3/2, it is slightly worse  Continue conservative management, avoiding bladder irritants and timed voiding's RTC in 6 months for IPSS, PSA and exam, as  testosterone therapy can cause prostate enlargement and worsen LUTS  3. Erectile dysfunction:    SHIM score is 24, it is stable  Continue sildenafil 20 mg RTC in 6 months for SHIM score and exam, as testosterone therapy can affect  erections  4. History of hematuria (high risk) Hematuria work up completed in 07/2019 - findings positive for 2 mm right inferior pole of kidney No report of gross hematuria  UA 12/2019 No RBC's seen RTC in one year for UA - patient to report any gross hematuria in the interim     No follow-ups on file.  These notes generated with voice recognition software. I apologize for typographical errors.  Zara Council, PA-C  [facility] [address] [place], [place] [address] [phone]

## 2021-01-13 NOTE — Progress Notes (Addendum)
01/14/2021 1:45 PM   Jeffrey Cook. 02/07/1966 [id number]  Referring provider: Barbaraann Boys, MD [place],  [place] [address]  Chief Complaint  Patient presents with  . testosterone deficiency   Urological history 1. High risk hematuria - Non-smoker.  - CTU 07/2019 Punctate 1-2 mm stone inferior pole right kidney.  No ureterolithiasis.  No hydronephrosis.  No suspicious enhancing renal masses.  No abnormal filling defects within the opacified renal collecting systems and ureters.  Pulmonary nodules measuring up to 4 mm. No follow-up needed if patient is low-risk (and has no known or suspected primary neoplasm). Non-contrast chest CT can be considered in 12 months if patient is high-risk. Hepatic steatosis.   - Cysto with Dr. Erlene Quan 07/2019 NED.  - UA negative for micro heme  2. Testosterone deficiency - managing with testosterone cypionate 200 mg/cc, 0.5 cc q weekly - testosterone level 629 ng/dL in 12/2020  3. ED - SHIM 24 - contributing factors of age, BPH, testosterone deficiency, DM, HTN and HLD - managing with sildenafil 20 mg on demand dosing  4. BPH with LU TS - PSA 0.24 in 12/2020 - I PSS 3/1    HPI: Jeffrey Cook. is a 55 y.o. male who presents today for a 6 month follow-up.  He states that he has noticed "lumps" his inguinal area for the last few months.  They have not changed and they are not tender.  He has lost 7 lbs by discontinuing his protein shake.  He denies any night sweats or appetite loss.    Patient still having spontaneous erections.  He denies any pain or curvature with erections.     SHIM    Row Name 01/14/21 1312         SHIM: Over the last 6 months:   How do you rate your confidence that you could get and keep an erection? High     When you had erections with sexual stimulation, how often were your erections hard enough for penetration (entering your partner)? Almost Always or Always     During sexual intercourse,  how often were you able to maintain your erection after you had penetrated (entered) your partner? Almost Always or Always     During sexual intercourse, how difficult was it to maintain your erection to completion of intercourse? Not Difficult     When you attempted sexual intercourse, how often was it satisfactory for you? Almost Always or Always           SHIM Total Score   SHIM 24            Score: 1-7 Severe ED 8-11 Moderate ED 12-16 Mild-Moderate ED 17-21 Mild ED 22-25 No ED  Patient denies any modifying or aggravating factors.  Patient denies any gross hematuria, dysuria or suprapubic/flank pain.  Patient denies any fevers, chills, nausea or vomiting.    IPSS    Row Name 01/14/21 1300         International Prostate Symptom Score   How often have you had the sensation of not emptying your bladder? Not at All     How often have you had to urinate less than every two hours? Less than 1 in 5 times     How often have you found you stopped and started again several times when you urinated? Not at All     How often have you found it difficult to postpone urination? Less than 1 in 5 times  How often have you had a weak urinary stream? Not at All     How often have you had to strain to start urination? Not at All     How many times did you typically get up at night to urinate? 1 Time     Total IPSS Score 3           Quality of Life due to urinary symptoms   If you were to spend the rest of your life with your urinary condition just the way it is now how would you feel about that? Pleased            Score:  1-7 Mild 8-19 Moderate 20-35 Severe    PMH: Past Medical History:  Diagnosis Date  . Allergic rhinitis   . BPH with obstruction/lower urinary tract symptoms   . Chronic headaches   . Diabetes mellitus without complication ([place])   . Diverticulosis   . Dyslipidemia   . Erectile dysfunction   . Fatty liver   . GERD (gastroesophageal reflux disease)   .  Hypogonadism in male   . Personal history of multiple concussions   . Steatohepatitis, non-alcoholic     Surgical History: Past Surgical History:  Procedure Laterality Date  . LIVER BIOPSY  06/10/2009  . MYELOGRAM    . NASAL SINUS SURGERY      Home Medications:  Allergies as of 01/14/2021      Reactions   Codeine    Fenofibrate Other (See Comments)   Fish Oil    Other reaction(s): Other (See Comments) Dyspepsia, foul taste, loose stools - tolerates Lovaza   Niacin And Related    Prednisone    Headache   Tricor [fenofibrate]       Medication List       Accurate as of January 14, 2021  1:45 PM. If you have any questions, ask your nurse or doctor.        aspirin 81 MG tablet Take 81 mg by mouth daily.   atorvastatin 10 MG tablet Commonly known as: LIPITOR Take 10 mg by mouth daily.   buPROPion 150 MG 12 hr tablet Commonly known as: WELLBUTRIN SR Take 150 mg by mouth 2 (two) times daily.   cholecalciferol 10 MCG (400 UNIT) Tabs tablet Commonly known as: VITAMIN D3 Take 1,000 Units by mouth daily.   fenofibrate 160 MG tablet Take 160 mg by mouth daily.   lisinopril 5 MG tablet Commonly known as: ZESTRIL Take 5 mg by mouth daily.   metFORMIN 500 MG 24 hr tablet Commonly known as: GLUCOPHAGE-XR Take 500 mg by mouth 3 (three) times daily.   propranolol 10 MG tablet Commonly known as: INDERAL Take by mouth.   sildenafil 20 MG tablet Commonly known as: REVATIO Take 3 to 5 tablets two hours before intercouse on an empty stomach.  Do not take with nitrates.   SUMAtriptan 50 MG tablet Commonly known as: IMITREX Take by mouth. What changed: Another medication with the same name was removed. Continue taking this medication, and follow the directions you see here. Changed by: Zara Council, PA-C   testosterone cypionate 200 MG/ML injection Commonly known as: DEPOTESTOSTERONE CYPIONATE ADMINISTER 0.5 ML IN THE MUSCLE EVERY WEEK   Vascepa 1 g  capsule Generic drug: icosapent Ethyl TK 2 CS PO BID   vitamin B-12 1000 MCG tablet Commonly known as: CYANOCOBALAMIN Take by mouth.   vitamin E 180 MG (400 UNITS) capsule Take 800 Units by mouth daily.  Allergies:  Allergies  Allergen Reactions  . Codeine   . Fenofibrate Other (See Comments)  . Fish Oil     Other reaction(s): Other (See Comments) Dyspepsia, foul taste, loose stools - tolerates Lovaza  . Niacin And Related   . Prednisone     Headache  . Tricor [Fenofibrate]     Family History: Family History  Problem Relation Age of Onset  . Hypertension Mother   . Alcohol abuse Mother   . Diabetes Mother   . Stroke Mother   . Transient ischemic attack Father   . Hyperlipidemia Father   . Melanoma Father   . Heart attack Father   . Kidney cancer Neg Hx   . Prostate cancer Neg Hx   . Bladder Cancer Neg Hx     Social History:  reports that he has never smoked. He has never used smokeless tobacco. He reports current alcohol use. He reports that he does not use drugs.  ROS: For pertinent review of systems please refer to history of present illness  Physical Exam: BP 127/84   Pulse 83   Ht 5' 10.5" (1.791 m)   Wt 185 lb (83.9 kg)   BMI 26.17 kg/m   Constitutional:  Well nourished. Alert and oriented, No acute distress. HEENT: [place] AT, mask in place.  Trachea midline Cardiovascular: No clubbing, cyanosis, or edema. Respiratory: Normal respiratory effort, no increased work of breathing. GU: No CVA tenderness.  No bladder fullness or masses.  Patient with uncircumcised phallus.  Foreskin easily retracted  Urethral meatus is patent.  No penile discharge. No penile lesions or rashes. Scrotum without lesions, cysts, rashes and/or edema.  Testicles are located scrotally bilaterally. No masses are appreciated in the testicles. Left and right epididymis are normal. Rectal: Patient with  normal sphincter tone. Anus and perineum without scarring or rashes. No rectal  masses are appreciated. Prostate is approximately 45 grams, no nodules are appreciated. Seminal vesicle could not be palpated Lymph: Bilateral non -tender inguinal adenopathy. Neurologic: Grossly intact, no focal deficits, moving all 4 extremities. Psychiatric: Normal mood and affect.  Laboratory Data: Specimen:  Blood  Ref Range & Units 1 mo ago  Uric Acid 4.8 - 8.7 mg/dL 3.8Low   Resulting Agency  [facility]  Specimen Collected: 11/20/20 10:54 AM Last Resulted: 11/20/20 11:47 AM  Received From: [facility]  Result Received: 12/11/20 9:33 AM   Specimen:  Blood  Ref Range & Units 2 mo ago  Hemoglobin A1C <6.5 % 7.7High   Average Blood Glucose (Calculated From HgBA1c Level) mg/dL 178   Resulting Agency  [facility]   Narrative HBA1C results between 5.7% and 6.4% are suggestive of prediabetes  HBA1C result greater than or equal to 6.5% are diagnostic of diabetes Specimen Collected: 11/07/20 8:07 AM Last Resulted: 11/07/20 4:42 PM  Received From: [facility]  Result Received: 12/11/20 9:33 AM   Specimen:  Blood  Ref Range & Units 2 mo ago Comments  Sodium 135 - 145 mmol/L 135    Potassium 3.5 - 5.0 mmol/L 4.2    Chloride 98 - 108 mmol/L 103    Carbon Dioxide (CO2) 21 - 30 mmol/L 24    Urea Nitrogen (BUN) 7 - 20 mg/dL 15    Creatinine 0.6 - 1.3 mg/dL 1.1    Glucose 70 - 140 mg/dL 158High  Interpretive Data:  Above is the NONFASTING reference range.   Below are the FASTING reference ranges:  NORMAL:  70-99 mg/dL  PREDIABETES: 100-125 mg/dL  DIABETES:  > 125 mg/dL   Calcium 8.7 - 10.2 mg/dL 8.9    AST (Aspartate Aminotransferase) 15 - 41 U/L 25    ALT (Alanine Aminotransferase) 17 - 63 U/L 29    Bilirubin, Total 0.4 - 1.5 mg/dL 0.5    Alk Phos (Alkaline Phosphatase) 24 - 110 U/L 49    Albumin 3.5 - 4.8 g/dL 4.0    Protein, Total 6.2 - 8.1 g/dL 6.5    Anion Gap 3 - 12 mmol/L 8     BUN/CREA Ratio 6 - 27 14    Glomerular Filtration Rate (eGFR)  mL/min/1.73sq m 75   NON-Modified eGFR: 75 mL/min/1.73sq m .  We recommend using this value for referral decisions.   Modified eGFR : 87 mL/min/1.73sq m .  This eGFR estimates kidney function using the CKD-Epi equation that increases eGFR by 16% for patients identified as Black/African-American in the medical record. This value was previously reported as the single eGFR before 06/05/2020.   Interpretive Ranges for eGFR(CKD-EPI):   eGFR:       > 60 mL/min/1.73 sq m - Normal  eGFR:       30 - 59 mL/min/1.73 sq m - Moderately Decreased  eGFR:       15 - 29 mL/min/1.73 sq m - Severely Decreased  eGFR:       < 15 mL/min/1.73 sq m - Kidney Failure   Note: These eGFR calculations do not apply in acute situations  when eGFR is changing rapidly or in patients on dialysis.   Resulting Agency  [facility]   Specimen Collected: 11/07/20 8:07 AM Last Resulted: 11/07/20 4:27 PM  Received From: [facility]  Result Received: 12/11/20 9:33 AM    Urinalysis Component     Latest Ref Rng & Units 01/14/2021  Color, Urine     YELLOW YELLOW  Appearance     CLEAR CLEAR  Specific Gravity, Urine     1.005 - 1.030 1.010  pH     5.0 - 8.0 5.5  Glucose, UA     NEGATIVE mg/dL NEGATIVE  Hgb urine dipstick     NEGATIVE NEGATIVE  Bilirubin Urine     NEGATIVE NEGATIVE  Ketones, ur     NEGATIVE mg/dL NEGATIVE  Protein     NEGATIVE mg/dL NEGATIVE  Nitrite     NEGATIVE NEGATIVE  Leukocytes,Ua     NEGATIVE NEGATIVE  Squamous Epithelial / LPF     0 - 5 0-5  WBC, UA     0 - 5 WBC/hpf 0-5  RBC / HPF     0 - 5 RBC/hpf NONE SEEN  Bacteria, UA     NONE SEEN NONE SEEN    PSA History  0.22 ng/mL on 04/24/2017  0.3   ng/mL on 10/13/2016   Component     Latest Ref Rng & Units 02/05/2018 05/26/2018 06/30/2019 01/09/2020  Prostatic Specific Antigen     0.00 - 4.00 ng/mL  0.35 0.31 0.24 0.21   Component     Latest Ref Rng & Units 07/13/2020 01/11/2021  Prostatic Specific Antigen     0.00 - 4.00 ng/mL 0.24 0.24   Lab Results  Component Value Date   WBC 5.2 04/24/2017   HGB 13.6 01/11/2021   HCT 43.6 01/11/2021   MCV 79.3 (L) 04/24/2017   PLT 159 04/24/2017    Lab Results  Component Value Date   TESTOSTERONE 629 01/11/2021  I  have reviewed the labs.  Assessment & Plan:    1. Testosterone deficiency  Testosterone level is therapeutic  (goal 450-600 ng/dL) Continue testosterone cypionate 200 mg/cc, 0.5 cc q week - patient will continue self injection  RTC in 6 months for HCT, HBG, testosterone, PSA and exam  2. BPH with LUTS IPSS score is 3/1, it is slightly worse  Continue conservative management, avoiding bladder irritants and timed voiding's RTC in 6 months for IPSS, PSA and exam, as testosterone therapy can cause prostate enlargement and worsen LUTS  3. Erectile dysfunction:    SHIM score is 24, it is stable  Continue sildenafil 20 mg RTC in 6 months for SHIM score and exam, as testosterone therapy can affect erections  4. History of hematuria (high risk) Hematuria work up completed in 07/2019 - findings positive for 2 mm right inferior pole of kidney No report of gross hematuria  UA negative for micro heme RTC in one year for UA - patient to report any gross hematuria in the interim    5. Bilateral inguinal lymphadenopathy Advised to have appointment with his PCP as there is a small chance of malignancy for further evaluation  Return in about 6 months (around 07/14/2021) for PSA, testosterone (one week after injection), LFT's,  H & H, IPSS, SHIM and exam.  These notes generated with voice recognition software. I apologize for typographical errors.  Zara Council, PA-C  [facility] [address] [place], [place] [address] [phone]

## 2021-01-14 ENCOUNTER — Ambulatory Visit: Payer: BC Managed Care – PPO | Admitting: Urology

## 2021-01-14 ENCOUNTER — Other Ambulatory Visit: Payer: Self-pay

## 2021-01-14 ENCOUNTER — Encounter: Payer: Self-pay | Admitting: Urology

## 2021-01-14 ENCOUNTER — Other Ambulatory Visit
Admission: RE | Admit: 2021-01-14 | Discharge: 2021-01-14 | Disposition: A | Discharge status: Home / Self Care | Payer: BC Managed Care – PPO | Attending: Urology | Admitting: Urology

## 2021-01-14 VITALS — BP 127/84 | HR 83 | Ht 70.5 in | Wt 185.0 lb

## 2021-01-14 DIAGNOSIS — E291 Testicular hypofunction: Secondary | ICD-10-CM

## 2021-01-14 DIAGNOSIS — N401 Enlarged prostate with lower urinary tract symptoms: Secondary | ICD-10-CM

## 2021-01-14 DIAGNOSIS — N529 Male erectile dysfunction, unspecified: Secondary | ICD-10-CM | POA: Diagnosis not present

## 2021-01-14 DIAGNOSIS — E349 Endocrine disorder, unspecified: Secondary | ICD-10-CM | POA: Diagnosis not present

## 2021-01-14 DIAGNOSIS — N138 Other obstructive and reflux uropathy: Secondary | ICD-10-CM | POA: Diagnosis present

## 2021-01-14 DIAGNOSIS — R319 Hematuria, unspecified: Secondary | ICD-10-CM | POA: Diagnosis not present

## 2021-01-14 DIAGNOSIS — R591 Generalized enlarged lymph nodes: Secondary | ICD-10-CM

## 2021-01-14 LAB — URINALYSIS, COMPLETE (UACMP) WITH MICROSCOPIC
Bacteria, UA: NONE SEEN
Bilirubin Urine: NEGATIVE
Glucose, UA: NEGATIVE mg/dL
Hgb urine dipstick: NEGATIVE
Ketones, ur: NEGATIVE mg/dL
Leukocytes,Ua: NEGATIVE
Nitrite: NEGATIVE
Protein, ur: NEGATIVE mg/dL
RBC / HPF: NONE SEEN RBC/hpf (ref 0–5)
Specific Gravity, Urine: 1.01 (ref 1.005–1.030)
pH: 5.5 (ref 5.0–8.0)

## 2021-01-14 MED ORDER — TESTOSTERONE CYPIONATE 200 MG/ML IM SOLN
INTRAMUSCULAR | 0 refills | Status: DC
Start: 2021-01-14 — End: 2021-07-15

## 2021-07-12 ENCOUNTER — Other Ambulatory Visit: Payer: Self-pay

## 2021-07-12 ENCOUNTER — Other Ambulatory Visit
Admission: RE | Admit: 2021-07-12 | Discharge: 2021-07-12 | Disposition: A | Discharge status: Home / Self Care | Payer: BC Managed Care – PPO | Attending: Urology | Admitting: Urology

## 2021-07-12 ENCOUNTER — Other Ambulatory Visit: Payer: Self-pay | Admitting: *Deleted

## 2021-07-12 DIAGNOSIS — E349 Endocrine disorder, unspecified: Secondary | ICD-10-CM

## 2021-07-12 DIAGNOSIS — E291 Testicular hypofunction: Secondary | ICD-10-CM | POA: Diagnosis present

## 2021-07-12 LAB — HEMOGLOBIN AND HEMATOCRIT, BLOOD
HCT: 44.2 % (ref 39.0–52.0)
Hemoglobin: 14.7 g/dL (ref 13.0–17.0)

## 2021-07-12 LAB — PSA: Prostatic Specific Antigen: 0.21 ng/mL (ref 0.00–4.00)

## 2021-07-13 LAB — TESTOSTERONE: Testosterone: 760 ng/dL (ref 264–916)

## 2021-07-14 NOTE — Progress Notes (Signed)
07/15/2021 1:50 PM   Jeffrey Cook. 1966/08/24 [id number]  Referring provider: Barbaraann Boys, MD [place],  [place] [address]  Urological history: 1. High risk hematuria - Non-smoker.  - CTU 07/2019 Punctate 1-2 mm stone inferior pole right kidney.  No ureterolithiasis.  No hydronephrosis.  No suspicious enhancing renal masses.  No abnormal filling defects within the opacified renal collecting systems and ureters.  Pulmonary nodules measuring up to 4 mm. No follow-up needed if patient is low-risk (and has no known or suspected primary neoplasm). Non-contrast chest CT can be considered in 12 months if patient is high-risk. Hepatic steatosis.   - Cysto with Dr. Erlene Quan 07/2019 NED -no reports of gross heme - UA negative for micro heme 12/2020  2. Testosterone deficiency - managing with testosterone cypionate 200 mg/cc, 0.5 cc q weekly - testosterone level 720 ng/dL in 06/2021 - HCT and hemoglobin WNL in 06/2021  3. ED - SHIM 25 - contributing factors of age, BPH, testosterone deficiency, DM, HTN and HLD - managing with sildenafil 20 mg on demand dosing  4. BPH with LU TS - PSA 0.21 in 06/2021 - I PSS 3/2  Chief Complaint  Patient presents with   Follow-up    36mh follow-up      HPI: Jeffrey Cook is a 55y.o. male who presents today for a 6 month follow-up.  Patient still having spontaneous erections.   He denies any pain or curvature with erections.      SHIM     Row Name 07/15/21 1328         SHIM: Over the last 6 months:   How do you rate your confidence that you could get and keep an erection? Very High     When you had erections with sexual stimulation, how often were your erections hard enough for penetration (entering your partner)? Almost Always or Always     During sexual intercourse, how often were you able to maintain your erection after you had penetrated (entered) your partner? Almost Always or Always     During sexual  intercourse, how difficult was it to maintain your erection to completion of intercourse? Not Difficult     When you attempted sexual intercourse, how often was it satisfactory for you? Almost Always or Always           SHIM Total Score     SHIM 25              Score: 1-7 Severe ED 8-11 Moderate ED 12-16 Mild-Moderate ED 17-21 Mild ED 22-25 No ED  He has post void dribbling which he attributes to constipation.  He has urgency when he drinks water.  He can drive for long distances without an issue.    Patient denies any modifying or aggravating factors.  Patient denies any gross hematuria, dysuria or suprapubic/flank pain.  Patient denies any fevers, chills, nausea or vomiting.     IPSS     Row Name 07/15/21 1300         International Prostate Symptom Score   How often have you had the sensation of not emptying your bladder? Not at All     How often have you had to urinate less than every two hours? Less than half the time     How often have you found you stopped and started again several times when you urinated? Not at All     How often have you found it difficult to  postpone urination? Not at All     How often have you had a weak urinary stream? Not at All     How often have you had to strain to start urination? Not at All     How many times did you typically get up at night to urinate? 1 Time     Total IPSS Score 3           Quality of Life due to urinary symptoms     If you were to spend the rest of your life with your urinary condition just the way it is now how would you feel about that? Mostly Satisfied              Score:  1-7 Mild 8-19 Moderate 20-35 Severe    PMH: Past Medical History:  Diagnosis Date   Allergic rhinitis    BPH with obstruction/lower urinary tract symptoms    Chronic headaches    Diabetes mellitus without complication (HCC)    Diverticulosis    Dyslipidemia    Erectile dysfunction    Fatty liver    GERD (gastroesophageal  reflux disease)    Hypogonadism in male    Personal history of multiple concussions    Steatohepatitis, non-alcoholic     Surgical History: Past Surgical History:  Procedure Laterality Date   LIVER BIOPSY  06/10/2009   MYELOGRAM     NASAL SINUS SURGERY      Home Medications:  Allergies as of 07/15/2021       Reactions   Codeine    Fenofibrate Other (See Comments)   Fish Oil    Other reaction(s): Other (See Comments) Dyspepsia, foul taste, loose stools - tolerates Lovaza   Niacin    Other reaction(s): Liver Disorder He has a history of liver test elevations while taking niacin prior to 2006. No niacin trial since then. 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   Niacin And Related    Prednisone    Headache   Tricor [fenofibrate]         Medication List        Accurate as of July 15, 2021  1:50 PM. If you have any questions, ask your nurse or doctor.          STOP taking these medications    atorvastatin 10 MG tablet Commonly known as: LIPITOR Stopped by: Zara Council, PA-C       TAKE these medications    aspirin 81 MG tablet Take 81 mg by mouth daily.   buPROPion 150 MG 12 hr tablet Commonly known as: WELLBUTRIN SR Take 150 mg by mouth 2 (two) times daily.   cholecalciferol 10 MCG (400 UNIT) Tabs tablet Commonly known as: VITAMIN D3 Take 1,000 Units by mouth daily.   fenofibrate 160 MG tablet Take 160 mg by mouth daily.   lisinopril 5 MG tablet Commonly known as: ZESTRIL Take 5 mg by mouth daily.   metFORMIN 500 MG 24 hr tablet Commonly known as: GLUCOPHAGE-XR Take 500 mg by mouth 3 (three) times daily.   propranolol 10 MG tablet Commonly known as: INDERAL Take by mouth.   rosuvastatin 5 MG tablet Commonly known as: CRESTOR Take 1 tablet by mouth daily.   sildenafil 20 MG tablet Commonly known as: REVATIO Take 3 to 5 tablets two hours before intercouse on an empty stomach.  Do not take with nitrates.   SUMAtriptan 50  MG tablet Commonly known as: IMITREX Take by mouth.  testosterone cypionate 200 MG/ML injection Commonly known as: DEPOTESTOSTERONE CYPIONATE ADMINISTER 0.5 ML IN THE MUSCLE EVERY WEEK   Vascepa 1 g capsule Generic drug: icosapent Ethyl TK 2 CS PO BID   vitamin B-12 1000 MCG tablet Commonly known as: CYANOCOBALAMIN Take by mouth.   vitamin E 180 MG (400 UNITS) capsule Take 800 Units by mouth daily.        Allergies:  Allergies  Allergen Reactions   Codeine    Fenofibrate Other (See Comments)   Fish Oil     Other reaction(s): Other (See Comments) Dyspepsia, foul taste, loose stools - tolerates Lovaza   Niacin     Other reaction(s): Liver Disorder He has a history of liver test elevations while taking niacin prior to 2006. No niacin trial since then. 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   Niacin And Related    Prednisone     Headache   Tricor [Fenofibrate]     Family History: Family History  Problem Relation Age of Onset   Hypertension Mother    Alcohol abuse Mother    Diabetes Mother    Stroke Mother    Transient ischemic attack Father    Hyperlipidemia Father    Melanoma Father    Heart attack Father    Kidney cancer Neg Hx    Prostate cancer Neg Hx    Bladder Cancer Neg Hx     Social History:  reports that he has never smoked. He has never used smokeless tobacco. He reports current alcohol use. He reports that he does not use drugs.  ROS: For pertinent review of systems please refer to history of present illness  Physical Exam: BP 116/80   Pulse 81   Ht _0  (1.803 m)   Wt 199 lb (90.3 kg)   BMI 27.75 kg/m   Constitutional:  Well nourished. Alert and oriented, No acute distress. HEENT: [place] AT, mask in pain  Trachea midline Cardiovascular: No clubbing, cyanosis, or edema. Respiratory: Normal respiratory effort, no increased work of breathing. GU: No CVA tenderness.  No bladder fullness or masses.  Patient with uncircumcised  phallus. Foreskin easily retracted  Urethral meatus is patent.  No penile discharge. No penile lesions or rashes. Scrotum without lesions, cysts, rashes and/or edema.  Testicles are located scrotally bilaterally. No masses are appreciated in the testicles. Left and right epididymis are normal.  Inguinal lymphadenopathy stable.  Rectal: Patient with  normal sphincter tone. Anus and perineum without scarring or rashes. No rectal masses are appreciated. Prostate is approximately 45 grams, no nodules are appreciated. Seminal vesicles could not be palpated.  Skin: No rashes, bruises or suspicious lesions. Lymph: No inguinal adenopathy. Neurologic: Grossly intact, no focal deficits, moving all 4 extremities. Psychiatric: Normal mood and affect.   Laboratory Data: Cholesterol, Total mg/dL 173   Comment: The significance of total cholesterol depends on the values of individual components including HDL, LDL, non-HDL, and triglycerides.  LDL Calculated    Comment: UNABLE TO DETERMINE LDL DUE TO TRIG >4OO   <70 mg/dL      Desired target for prior heart disease, stroke, and those at high-risk. Even lower levels may be recommended to decrease risk of heart attack and stroke.  70-159 mg/dL   Comprehensive cardiovascular risk assessment is recommended. Statin therapy may be advised based on risk factors.  160-189 mg/dL  Moderately elevated LDL level. Statin therapy recommended if other risk factors present.  >=190 mg/dL    Severely elevated LDL  level. High long-term risk of heart disease and stroke. High-intensity statin therapy recommended for most people. Consider specialist referral.   *A healthy diet and exercise are recommended for all to reduce heart disease risk. Statin choice should be based on patient preference after patient-provider discussions.     Ref: 2018 ACC/AHA Guideline  HDL mg/dL 29   Comment:   People with low HDL levels (see below) are at increased risk of heart disease:  <50 mg/dL for  Women  <40 mg/dL for Men  Triglyceride <500 mg/dL 441   Comment:   <150 mg/dL      Normal  150-499 mg/dL   High Triglycerides. Risk of heart disease may be increased. Address reversible causes (eg sugar in foods and beverages, alcohol, and diabetes control). Medication may be appropriate based on other clinical factors.  >=500 mg/dL     Very High Triglycerides. Risk of heart disease and pancreatitis increased. Address reversible causes as above. Medication to lower triglycerides usually advised.    *Ranges provided for adults, pediatric guidelines vary.  Resulting Agency  [facility]  Specimen Collected: 02/28/21 09:02 Last Resulted: 02/28/21 16:37  Received From: [facility]  Result Received: 07/12/21 08:01   [facility] SARS-COV-2 RNA, QL NAAT, RT PCR/TMA (COVID-19) NOT DETECTED DETECTED Abnormal    Comment:   A Detected result indicates that the patient's specimen  was positive for SARS-CoV-2 RNA.        Test Method: Nucleic Acid Amplification Test including  reverse transcription polymerase chain reaction (RT-PCR)  and transcription mediated amplification (TMA). The test  method meets the [place] Centers for Disease Control and  prevention (CDC) pre departure and arrival requirement  for viral test for COVID-19 dated January 19, 2020.  Testing requirements for traveling may change with time.  The patient is responsible for determining the test  requirements for each nation while they are traveling.   This test has been authorized by the FDA under an  Emergency Use Authorization (EUA) for use by authorized  laboratories.      Please review the "Fact Sheets" and FDA authorized  labeling available for health care providers and  patients using the following websites:  [email or web address]  [email or web address]  fact-sheet2      Due to the current public health  emergency, [facility] is accepting samples from appropriate  clinical sources collected using wide variety of  swabs and transport media for COVID-19. Not detected  test results derived from specimens received in non-  commercially manufactured viral collection kits or those  not yet authorized by FDA for COVID-19 testing should be  cautiously evaluated and take extra precautions such as  additional clinical monitoring, including collection  of an additional specimen.        Additional information about COVID-19 can be found  at the [employer] website:  [email or web address].  Resulting Agency  [facility]  Specimen Collected: 06/10/21 14:27 Last Resulted: 06/11/21 20:43  Received From: [facility]  Result Received: 07/12/21 08:01   View Encounter    Received Information  Sodium 135 - 145 mmol/L 137   Potassium 3.5 - 5.0 mmol/L 4.1   Chloride 98 - 108 mmol/L 104   Carbon Dioxide (CO2) 21 - 30 mmol/L 24   Urea Nitrogen (BUN) 7 - 20 mg/dL 10   Creatinine 0.6 - 1.3 mg/dL 1.1   Glucose 70 - 140 mg/dL 229 High    Comment: Interpretive Data:  Above is the NONFASTING reference range.  Below are the FASTING reference ranges:  NORMAL:      70-99 mg/dL  PREDIABETES: 100-125 mg/dL  DIABETES:    > 125 mg/dL   Calcium 8.7 - 10.2 mg/dL 8.8   AST (Aspartate Aminotransferase) 15 - 41 U/L 25   ALT (Alanine Aminotransferase) 17 - 63 U/L 28   Bilirubin, Total 0.4 - 1.5 mg/dL 0.6   Alk Phos (Alkaline Phosphatase) 24 - 110 U/L 55   Albumin 3.5 - 4.8 g/dL 4.0   Protein, Total 6.2 - 8.1 g/dL 6.6   Anion Gap 3 - 12 mmol/L 9   BUN/CREA Ratio 6 - 27 9   Glomerular Filtration Rate (eGFR)  mL/min/1.73sq m 79   Comment: CKD-EPI (2021) does not include patient's race in the calculation of eGFR. Monitoring changes of plasma creatinine and eGFR over time is useful for monitoring kidney function.  This change was made on 02/19/2021.   Interpretive  Ranges for eGFR(CKD-EPI 2021):   eGFR:              > 60 mL/min/1.73 sq m - Normal  eGFR:              30 - 59 mL/min/1.73 sq m - Moderately Decreased  eGFR:              15 - 29 mL/min/1.73 sq m - Severely Decreased  eGFR:              < 15 mL/min/1.73 sq m -  Kidney Failure    Note: These eGFR calculations do not apply in acute situations  when eGFR is changing rapidly or in patients on dialysis.   Resulting Agency  [facility]  Specimen Collected: 06/27/21 08:22 Last Resulted: 06/27/21 15:34  Received From: [facility]  Result Received: 07/12/21 08:01   Received Information  Hemoglobin A1C <6.5 % 7.6 High    Average Blood Glucose (Calculated From HgBA1c Level) mg/dL 175   Resulting Agency  [facility]   Narrative Performed by [facility] The ADA has made the following Recommendations:  HBA1C results between 5.7% and 6.4% are suggestive of prediabetes  HBA1C result greater than or equal to 6.5% are diagnostic of diabetes Specimen Collected: 06/27/21 08:22 Last Resulted: 06/27/21 16:48  Received From: [facility]  Result Received: 07/12/21 08:01   Prothrombin Time 9.5 - 13.1 sec 11.0   Prothrombin INR 0.9 - 1.1 0.9   Comment: Reference Ranges:  DVT/PE/PVD = INR 2.0 - 3.0  Mechanical Heart Valve = INR 2.5 - 3.5   NOTE: The INR is not a PT Ratio and is valid only for Warfarin  patients  Resulting Agency  [facility]  Specimen Collected: 06/27/21 08:22 Last Resulted: 06/27/21 15:47  Received From: [facility]  Result Received: 07/12/21 08:01   WBC (White Blood Cell Count) 3.2 - 9.8 x10^9/L 4.6   Hemoglobin 13.7 - 17.3 g/dL 14.7   Hematocrit 39.0 - 49.0 % 46.2   Platelets 150 - 450 x10^9/L 204   MCV (Mean Corpuscular Volume) 80 - 98 fL 80   MCH (Mean Corpuscular Hemoglobin) 26.5 - 34.0 pg 25.5 Low    MCHC (Mean Corpuscular Hemoglobin  Concentration) 31.5 - 36.3 % 31.8   RBC (Red Blood Cell Count) 4.37 - 5.74 x10^12/L 5.76 High    RDW-CV (Red Cell Distribution Width) 11.5 - 14.5 % 16.2 High    NRBC (Nucleated Red Blood Cell Count) 0  x10^9/L 0.00   NRBC % (Nucleated Red Blood Cell %) % 0.0   MPV (Mean Platelet Volume) 7.2 - 11.7 fL 11.6   Neutrophil Count 2.0 - 8.6 x10^9/L 2.3   Neutrophil % 37 - 80 % 50.6   Lymphocyte Count 0.6 - 4.2 x10^9/L 1.4   Lymphocyte % 10 - 50 % 29.5   Monocyte Count 0 - 0.9 x10^9/L 0.4   Monocyte % 0 - 12 % 9.0   Eosinophil Count 0 - 0.70 x10^9/L 0.43   Eosinophil % 0 - 7 % 9.4 High    Basophil Count 0 - 0.20 x10^9/L 0.06   Basophil % 0 - 2 % 1.3   Immature Granulocyte Count <=0.06 x10^9/L 0.01   Immature Granulocyte % <=0.7 % 0.2   Resulting Agency  [facility]  Specimen Collected: 06/27/21 08:22 Last Resulted: 06/27/21 15:17  Received From: [facility]  Result Received: 07/12/21 08:01   Results for orders placed or performed during the hospital encounter of 07/12/21  Testosterone  Result Value Ref Range   Testosterone 760 264 - 916 ng/dL  Hemoglobin and hematocrit, blood  Result Value Ref Range   Hemoglobin 14.7 13.0 - 17.0 g/dL   HCT 44.2 39.0 - 52.0 %  PSA  Result Value Ref Range   Prostatic Specific Antigen 0.21 0.00 - 4.00 ng/mL   I have reviewed the labs.  Assessment & Plan:    1. Testosterone deficiency  -Testosterone level is therapeutic  -Continue testosterone cypionate 200 mg/cc, 0.5 cc q week - patient will continue self injection  -refill given for testosterone cypionate -testosterone level, H & H in 6 months   2. BPH with LUTS -bothersome symptom of post-void attributed to constipation -bothersome symptom of urgency attributed to diabetes and fluid intake -PSA in 6 months   3. Erectile dysfunction:    -continue sildenafil 20 mg -script given for sildenafil 20 mg to fill at a later date  4. History of hematuria (high  risk) -hematuria work up completed in 07/2019 - NED -No report of gross hematuria  -UA next visit patient to report any gross hematuria in the interim     Return in about 6 months (around 01/15/2022) for IPSS, SHIM and exam.  These notes generated with voice recognition software. I apologize for typographical errors.  Zara Council, PA-C  [facility] [address] [place], [place] [address] [phone]

## 2021-07-15 ENCOUNTER — Ambulatory Visit: Payer: BC Managed Care – PPO | Admitting: Urology

## 2021-07-15 ENCOUNTER — Encounter: Payer: Self-pay | Admitting: Urology

## 2021-07-15 ENCOUNTER — Other Ambulatory Visit: Payer: Self-pay

## 2021-07-15 VITALS — BP 116/80 | HR 81 | Ht 71.0 in | Wt 199.0 lb

## 2021-07-15 DIAGNOSIS — R319 Hematuria, unspecified: Secondary | ICD-10-CM

## 2021-07-15 DIAGNOSIS — E291 Testicular hypofunction: Secondary | ICD-10-CM

## 2021-07-15 DIAGNOSIS — N401 Enlarged prostate with lower urinary tract symptoms: Secondary | ICD-10-CM

## 2021-07-15 DIAGNOSIS — N529 Male erectile dysfunction, unspecified: Secondary | ICD-10-CM | POA: Diagnosis not present

## 2021-07-15 DIAGNOSIS — N138 Other obstructive and reflux uropathy: Secondary | ICD-10-CM

## 2021-07-15 DIAGNOSIS — E349 Endocrine disorder, unspecified: Secondary | ICD-10-CM | POA: Diagnosis not present

## 2021-07-15 MED ORDER — TESTOSTERONE CYPIONATE 200 MG/ML IM SOLN: INTRAMUSCULAR | 0 refills | Status: DC

## 2021-07-15 MED ORDER — SILDENAFIL CITRATE 20 MG PO TABS
ORAL_TABLET | ORAL | 3 refills | Status: DC
Start: 2021-07-15 — End: 2024-11-14

## 2021-07-15 NOTE — Addendum Note (Signed)
Addended by: Sueanne Margarita on: 07/15/2021 03:15 PM   Modules accepted: Orders

## 2021-10-01 ENCOUNTER — Telehealth: Payer: Self-pay | Admitting: Urology

## 2021-10-01 NOTE — Telephone Encounter (Signed)
Pt wants to know if you would put in orders for labwork in [place] for him to have hemoglobin, hematocrit, and testosterone.

## 2021-10-01 NOTE — Telephone Encounter (Signed)
Spoke with patient and advised results   

## 2021-10-01 NOTE — Telephone Encounter (Signed)
Called pt to advise that orders are for his appt in January and pt states she wants them done sooner because he is having muscle pain and whenever he has muscle pain, his hemoglobin and hematocrit is normally high, also pt states that [employer] told him it was high?

## 2021-10-01 NOTE — Telephone Encounter (Signed)
McGowan, Shannon A, PA-C  You 14 minutes ago (3:10 PM)   That would be fine.  Tell him to have his labs drawn this week.

## 2021-10-04 ENCOUNTER — Other Ambulatory Visit
Admission: RE | Admit: 2021-10-04 | Discharge: 2021-10-04 | Disposition: A | Discharge status: Home / Self Care | Payer: BC Managed Care – PPO | Attending: Urology | Admitting: Urology

## 2021-10-04 ENCOUNTER — Other Ambulatory Visit: Payer: Self-pay

## 2021-10-04 DIAGNOSIS — N401 Enlarged prostate with lower urinary tract symptoms: Secondary | ICD-10-CM | POA: Diagnosis present

## 2021-10-04 DIAGNOSIS — E349 Endocrine disorder, unspecified: Secondary | ICD-10-CM | POA: Diagnosis not present

## 2021-10-04 DIAGNOSIS — N529 Male erectile dysfunction, unspecified: Secondary | ICD-10-CM | POA: Insufficient documentation

## 2021-10-04 DIAGNOSIS — E291 Testicular hypofunction: Secondary | ICD-10-CM | POA: Insufficient documentation

## 2021-10-04 DIAGNOSIS — R319 Hematuria, unspecified: Secondary | ICD-10-CM | POA: Diagnosis present

## 2021-10-04 DIAGNOSIS — N138 Other obstructive and reflux uropathy: Secondary | ICD-10-CM | POA: Insufficient documentation

## 2021-10-04 LAB — HEMOGLOBIN AND HEMATOCRIT, BLOOD
HCT: 44.1 % (ref 39.0–52.0)
Hemoglobin: 14.5 g/dL (ref 13.0–17.0)

## 2021-10-04 LAB — PSA: Prostatic Specific Antigen: 0.13 ng/mL (ref 0.00–4.00)

## 2021-10-05 LAB — TESTOSTERONE: Testosterone: 85 ng/dL — ABNORMAL LOW (ref 264–916)

## 2022-01-08 ENCOUNTER — Other Ambulatory Visit
Admission: RE | Admit: 2022-01-08 | Discharge: 2022-01-08 | Disposition: A | Discharge status: Home / Self Care | Payer: BC Managed Care – PPO | Attending: Urology | Admitting: Urology

## 2022-01-08 ENCOUNTER — Other Ambulatory Visit: Payer: Self-pay

## 2022-01-08 DIAGNOSIS — E349 Endocrine disorder, unspecified: Secondary | ICD-10-CM

## 2022-01-08 LAB — HEMOGLOBIN AND HEMATOCRIT, BLOOD
HCT: 41.9 % (ref 39.0–52.0)
Hemoglobin: 13.2 g/dL (ref 13.0–17.0)

## 2022-01-09 LAB — TESTOSTERONE: Testosterone: 835 ng/dL (ref 264–916)

## 2022-01-12 NOTE — Progress Notes (Signed)
01/13/2022 1:57 PM   Jeffrey Cook. [birth year] [id number]  Referring provider: Barbaraann Boys, MD [place],  [place] [address]  Chief Complaint  Patient presents with   Follow-up     Urological history: 1. High risk hematuria - Non-smoker.  - CTU 07/2019 Punctate 1-2 mm stone inferior pole right kidney.  No ureterolithiasis.  No hydronephrosis.  No suspicious enhancing renal masses.  No abnormal filling defects within the opacified renal collecting systems and ureters.  Pulmonary nodules measuring up to 4 mm. No follow-up needed if patient is low-risk (and has no known or suspected primary neoplasm). Non-contrast chest CT can be considered in 12 months if patient is high-risk. Hepatic steatosis.   -Cysto with Dr. Erlene Quan 07/2019 NED -no reports of gross heme -UA negative for micro heme   2. Testosterone deficiency -contributing factors of age and diabetes -testosterone 835 12/2021 -hemoglobin 13.2 12/2021 -HCT 41.9% 12/2021 -managed with testosterone cypionate 200 mg/cc, 0.5 cc q weekly  3. ED -contributing factors of age, BPH, testosterone deficiency, DM, HTN and HLD -SHIM 25 -managing with sildenafil 20 mg on demand dosing  4. BPH with LU TS -PSA 0.13 09/2021 -I PSS 3/1  Chief Complaint  Patient presents with   Follow-up    HPI: Jeffrey Cook. is a 56 y.o. male who presents today for a 6 month follow-up.  At his visit on 07/15/2021, he was experiencing post-void dribbling and urgency with large water consumption.  He was injecting testosterone cypionate 200 MG/ML, 0.5 cc every week and refills were given.  He also given refills for sildenafil 20 mg, on-demand-dosing.  He underwent a cardiac echo on 01/08/2022 which noted an ejection fraction of 58% and trivial regurgitation, but no significant changes to prior echo from 2019.    Patient still having spontaneous erections.  He denies any pain or curvature with erections.     SHIM     Row  Name 01/13/22 1337         SHIM: Over the last 6 months:   How do you rate your confidence that you could get and keep an erection? Very High     When you had erections with sexual stimulation, how often were your erections hard enough for penetration (entering your partner)? Almost Always or Always     During sexual intercourse, how often were you able to maintain your erection after you had penetrated (entered) your partner? Almost Always or Always     During sexual intercourse, how difficult was it to maintain your erection to completion of intercourse? Not Difficult     When you attempted sexual intercourse, how often was it satisfactory for you? Almost Always or Always       SHIM Total Score   SHIM 25              Score: 1-7 Severe ED 8-11 Moderate ED 12-16 Mild-Moderate ED 17-21 Mild ED 22-25 No ED     He is still having post-void dribbling.  Patient denies any modifying or aggravating factors.  Patient denies any gross hematuria, dysuria or suprapubic/flank pain.  Patient denies any fevers, chills, nausea or vomiting.     IPSS     Row Name 01/13/22 1300         International Prostate Symptom Score   How often have you had the sensation of not emptying your bladder? Not at All     How often have you had to urinate less than every  two hours? Less than 1 in 5 times     How often have you found you stopped and started again several times when you urinated? Not at All     How often have you found it difficult to postpone urination? Less than 1 in 5 times     How often have you had a weak urinary stream? Not at All     How often have you had to strain to start urination? Not at All     How many times did you typically get up at night to urinate? 1 Time     Total IPSS Score 3       Quality of Life due to urinary symptoms   If you were to spend the rest of your life with your urinary condition just the way it is now how would you feel about that? Pleased               Score:  1-7 Mild 8-19 Moderate 20-35 Severe   PMH: Past Medical History:  Diagnosis Date   Allergic rhinitis    BPH with obstruction/lower urinary tract symptoms    Chronic headaches    Diabetes mellitus without complication (HCC)    Diverticulosis    Dyslipidemia    Erectile dysfunction    Fatty liver    GERD (gastroesophageal reflux disease)    Hypogonadism in male    Personal history of multiple concussions    Steatohepatitis, non-alcoholic     Surgical History: Past Surgical History:  Procedure Laterality Date   LIVER BIOPSY  06/10/2009   MYELOGRAM     NASAL SINUS SURGERY      Home Medications:  Allergies as of 01/13/2022       Reactions   Codeine    Fenofibrate Other (See Comments)   Fish Oil    Other reaction(s): Other (See Comments) Dyspepsia, foul taste, loose stools - tolerates Lovaza   Niacin    Other reaction(s): Liver Disorder He has a history of liver test elevations while taking niacin prior to 2006. No niacin trial since then. 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   Niacin And Related    Prednisone    Headache   Tricor [fenofibrate]         Medication List        Accurate as of January 13, 2022  1:57 PM. If you have any questions, ask your nurse or doctor.          aspirin 81 MG tablet Take 81 mg by mouth daily.   buPROPion 200 MG 12 hr tablet Commonly known as: WELLBUTRIN SR Take 1 tablet by mouth 2 (two) times daily. What changed: Another medication with the same name was removed. Continue taking this medication, and follow the directions you see here. Changed by: Zara Council, PA-C   cholecalciferol 10 MCG (400 UNIT) Tabs tablet Commonly known as: VITAMIN D3 Take 1,000 Units by mouth daily.   Dulaglutide 0.75 MG/0.5ML Sopn Inject into the skin.   fenofibrate 160 MG tablet Take 160 mg by mouth daily.   lisinopril 5 MG tablet Commonly known as: ZESTRIL Take 5 mg by mouth daily.   metFORMIN 500 MG  24 hr tablet Commonly known as: GLUCOPHAGE-XR Take 500 mg by mouth 3 (three) times daily.   Precision QID Test test strip Generic drug: glucose blood Use 1 each (1 strip total) once daily Use as instructed.   propranolol 10 MG tablet Commonly known as:  INDERAL Take by mouth.   rosuvastatin 5 MG tablet Commonly known as: CRESTOR Take 1 tablet by mouth daily.   sildenafil 20 MG tablet Commonly known as: REVATIO Take 3 to 5 tablets two hours before intercouse on an empty stomach.  Do not take with nitrates.   SUMAtriptan 50 MG tablet Commonly known as: IMITREX Take by mouth.   testosterone cypionate 200 MG/ML injection Commonly known as: DEPOTESTOSTERONE CYPIONATE ADMINISTER 0.5 ML IN THE MUSCLE EVERY WEEK   Vascepa 1 g capsule Generic drug: icosapent Ethyl TK 2 CS PO BID   vitamin B-12 1000 MCG tablet Commonly known as: CYANOCOBALAMIN Take by mouth.   vitamin E 180 MG (400 UNITS) capsule Take 800 Units by mouth daily.        Allergies:  Allergies  Allergen Reactions   Codeine    Fenofibrate Other (See Comments)   Fish Oil     Other reaction(s): Other (See Comments) Dyspepsia, foul taste, loose stools - tolerates Lovaza   Niacin     Other reaction(s): Liver Disorder He has a history of liver test elevations while taking niacin prior to 2006. No niacin trial since then. 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   Niacin And Related    Prednisone     Headache   Tricor [Fenofibrate]     Family History: Family History  Problem Relation Age of Onset   Hypertension Mother    Alcohol abuse Mother    Diabetes Mother    Stroke Mother    Transient ischemic attack Father    Hyperlipidemia Father    Melanoma Father    Heart attack Father    Kidney cancer Neg Hx    Prostate cancer Neg Hx    Bladder Cancer Neg Hx     Social History:  reports that he has never smoked. He has never used smokeless tobacco. He reports current alcohol use. He reports  that he does not use drugs.  ROS: For pertinent review of systems please refer to history of present illness  Physical Exam: BP 113/76 (BP Location: Left Arm, Patient Position: Sitting, Cuff Size: Large)    Pulse 93    Ht 5' 11"  (1.803 m)    Wt 185 lb (83.9 kg)    BMI 25.80 kg/m   Constitutional:  Well nourished. Alert and oriented, No acute distress. HEENT: [place] AT, mask in place.  Trachea midline Cardiovascular: No clubbing, cyanosis, or edema. Respiratory: Normal respiratory effort, no increased work of breathing. GU: No CVA tenderness.  No bladder fullness or masses.  Patient with circumcised phallus.  Urethral meatus is patent.  No penile discharge. No penile lesions or rashes. Scrotum without lesions, cysts, rashes and/or edema.  Testicles are located scrotally bilaterally. No masses are appreciated in the testicles. Left and right epididymis are normal. Rectal: Patient with  normal sphincter tone. Anus and perineum without scarring or rashes. No rectal masses are appreciated. Prostate is approximately 50 grams, no nodules are appreciated. Seminal vesicles could not be palpated Neurologic: Grossly intact, no focal deficits, moving all 4 extremities. Psychiatric: Normal mood and affect.   Laboratory Data: Results for orders placed or performed during the hospital encounter of 01/13/22  Urinalysis, Complete w Microscopic  Result Value Ref Range   Color, Urine YELLOW YELLOW   APPearance CLEAR CLEAR   Specific Gravity, Urine >1.030 (H) 1.005 - 1.030   pH 5.5 5.0 - 8.0   Glucose, UA NEGATIVE NEGATIVE mg/dL   Hgb urine  dipstick NEGATIVE NEGATIVE   Bilirubin Urine NEGATIVE NEGATIVE   Ketones, ur TRACE (A) NEGATIVE mg/dL   Protein, ur NEGATIVE NEGATIVE mg/dL   Nitrite NEGATIVE NEGATIVE   Leukocytes,Ua NEGATIVE NEGATIVE   Squamous Epithelial / LPF 0-5 0 - 5   WBC, UA 0-5 0 - 5 WBC/hpf   RBC / HPF 0-5 0 - 5 RBC/hpf   Bacteria, UA FEW (A) NONE SEEN   Mucus PRESENT    Hemoglobin A1C  <6.5 % 6.7 High    Average Blood Glucose (Calculated From HgBA1c Level) mg/dL 146   Resulting Agency  [facility]  Narrative Performed by [place] [facility] [employer] has made the following Recommendations:  HBA1C results between 5.7% and 6.4% are suggestive of prediabetes  HBA1C result greater than or equal to 6.5% are diagnostic of diabetes Specimen Collected: 12/03/21 09:06 Last Resulted: 12/03/21 18:23  Received From: [facility]  Result Received: 01/06/22 08:10   Thyroid Stimulating Hormone (TSH) 0.34 - 5.66 IU/mL 0.57   Resulting Agency  [facility]  Specimen Collected: 12/03/21 09:06 Last Resulted: 12/03/21 16:40  Received From: [facility]  Result Received: 01/06/22 08:10   Sodium 135 - 145 mmol/L 136   Potassium 3.5 - 5.0 mmol/L 4.3   Chloride 98 - 108 mmol/L 107   Carbon Dioxide (CO2) 21 - 30 mmol/L 23   Urea Nitrogen (BUN) 7 - 20 mg/dL 10   Creatinine 0.6 - 1.3 mg/dL 1.3   Glucose 70 - 140 mg/dL 106   Comment: Interpretive Data:  Above is the NONFASTING reference range.   Below are the FASTING reference ranges:  NORMAL:      70-99 mg/dL  PREDIABETES: 100-125 mg/dL  DIABETES:    > 125 mg/dL   Calcium 8.7 - 10.2 mg/dL 8.5 Low    AST (Aspartate Aminotransferase) 15 - 41 U/L 22   ALT (Alanine Aminotransferase) 17 - 63 U/L 19   Bilirubin, Total 0.4 - 1.5 mg/dL 0.2 Low    Alk Phos (Alkaline Phosphatase) 24 - 110 U/L 41   Albumin 3.5 - 4.8 g/dL 4.0   Protein, Total 6.2 - 8.1 g/dL 6.5   Anion Gap 3 - 12 mmol/L 6   BUN/CREA Ratio 6 - 27 8   Glomerular Filtration Rate (eGFR)  mL/min/1.73sq m 64   Comment: CKD-EPI (2021) does not include patient's race in the calculation of eGFR. Monitoring changes of plasma creatinine and eGFR over time is useful for monitoring kidney function.  This change was made on 02/19/2021.   Interpretive Ranges for eGFR(CKD-EPI 2021):   eGFR:              > 60  mL/min/1.73 sq m - Normal  eGFR:              30 - 59 mL/min/1.73 sq m - Moderately Decreased  eGFR:              15 - 29 mL/min/1.73 sq m - Severely Decreased  eGFR:              < 15 mL/min/1.73 sq m -  Kidney Failure    Note: These eGFR calculations do not apply in acute situations  when eGFR is changing rapidly or in patients on dialysis.   Resulting Agency  [facility]  Specimen Collected: 12/03/21 09:06 Last Resulted: 12/03/21 16:38  Received From: [facility]  Result Received: 01/06/22 08:10  I have  reviewed the labs.  Assessment & Plan:    1. Testosterone deficiency  -Testosterone level is therapeutic  -Continue testosterone cypionate 200 mg/cc, 0.5 cc q week - patient will continue self injection  -refill given for testosterone cypionate -testosterone level, H & H in 6 months   2. BPH with LUTS -PSA stable -DRE benign -UA benign -PVR < 300 cc -symptoms - mild post void dribbling -continue conservative management, avoiding bladder irritants and timed voiding's   3. Erectile dysfunction:    -continue sildenafil 20 mg -script given for sildenafil 20 mg to fill at a later date  4. High risk hematuria  -hematuria work up completed in 07/2019 - NED -No report of gross hematuria  -UA negative for micro heme   Return in about 6 months (around 07/13/2022) for PSA, testosterone (one week after injection) H & H, IPSS, SHIM and exam.  These notes generated with voice recognition software. I apologize for typographical errors.  Zara Council, PA-C  [facility] [address] [place], [place] [address] [phone]

## 2022-01-13 ENCOUNTER — Ambulatory Visit (INDEPENDENT_AMBULATORY_CARE_PROVIDER_SITE_OTHER): Payer: BC Managed Care – PPO | Admitting: Urology

## 2022-01-13 ENCOUNTER — Other Ambulatory Visit
Admission: RE | Admit: 2022-01-13 | Discharge: 2022-01-13 | Disposition: A | Discharge status: Home / Self Care | Payer: BC Managed Care – PPO | Attending: Urology | Admitting: Urology

## 2022-01-13 ENCOUNTER — Encounter: Payer: Self-pay | Admitting: Urology

## 2022-01-13 ENCOUNTER — Other Ambulatory Visit: Payer: Self-pay

## 2022-01-13 ENCOUNTER — Other Ambulatory Visit: Payer: Self-pay | Admitting: *Deleted

## 2022-01-13 VITALS — BP 113/76 | HR 93 | Ht 71.0 in | Wt 185.0 lb

## 2022-01-13 DIAGNOSIS — N529 Male erectile dysfunction, unspecified: Secondary | ICD-10-CM

## 2022-01-13 DIAGNOSIS — N138 Other obstructive and reflux uropathy: Secondary | ICD-10-CM | POA: Insufficient documentation

## 2022-01-13 DIAGNOSIS — N401 Enlarged prostate with lower urinary tract symptoms: Secondary | ICD-10-CM

## 2022-01-13 DIAGNOSIS — E349 Endocrine disorder, unspecified: Secondary | ICD-10-CM | POA: Diagnosis not present

## 2022-01-13 DIAGNOSIS — E291 Testicular hypofunction: Secondary | ICD-10-CM

## 2022-01-13 DIAGNOSIS — R319 Hematuria, unspecified: Secondary | ICD-10-CM

## 2022-01-13 LAB — URINALYSIS, COMPLETE (UACMP) WITH MICROSCOPIC
Bilirubin Urine: NEGATIVE
Glucose, UA: NEGATIVE mg/dL
Hgb urine dipstick: NEGATIVE
Leukocytes,Ua: NEGATIVE
Nitrite: NEGATIVE
Protein, ur: NEGATIVE mg/dL
Specific Gravity, Urine: >1.03 — ABNORMAL HIGH (ref 1.005–1.030)
pH: 5.5 (ref 5.0–8.0)

## 2022-01-13 MED ORDER — TESTOSTERONE CYPIONATE 200 MG/ML IM SOLN: INTRAMUSCULAR | 0 refills | Status: DC

## 2022-05-04 ENCOUNTER — Other Ambulatory Visit: Payer: Self-pay | Admitting: Urology

## 2022-05-04 DIAGNOSIS — E291 Testicular hypofunction: Secondary | ICD-10-CM

## 2022-07-08 ENCOUNTER — Telehealth: Payer: Self-pay

## 2022-07-08 ENCOUNTER — Other Ambulatory Visit: Payer: Self-pay

## 2022-07-08 DIAGNOSIS — N529 Male erectile dysfunction, unspecified: Secondary | ICD-10-CM

## 2022-07-08 DIAGNOSIS — N138 Other obstructive and reflux uropathy: Secondary | ICD-10-CM

## 2022-07-08 DIAGNOSIS — E349 Endocrine disorder, unspecified: Secondary | ICD-10-CM

## 2022-07-08 NOTE — Telephone Encounter (Signed)
-----   Message from Jeffrey Cook sent at 07/08/2022  1:05 PM EDT ----- Pt wants to make sure that the lab order is in for his testosterone, psa and hemoglobin before he goes to the lab.. he stated that they are never ready when he shows up for the appt.

## 2022-07-08 NOTE — Telephone Encounter (Signed)
Notified pt lab orders were in.

## 2022-07-09 ENCOUNTER — Other Ambulatory Visit
Admission: RE | Admit: 2022-07-09 | Discharge: 2022-07-09 | Disposition: A | Discharge status: Home / Self Care | Payer: BC Managed Care – PPO | Attending: Urology | Admitting: Urology

## 2022-07-09 DIAGNOSIS — E291 Testicular hypofunction: Secondary | ICD-10-CM | POA: Diagnosis present

## 2022-07-09 DIAGNOSIS — R319 Hematuria, unspecified: Secondary | ICD-10-CM | POA: Insufficient documentation

## 2022-07-09 DIAGNOSIS — N529 Male erectile dysfunction, unspecified: Secondary | ICD-10-CM | POA: Insufficient documentation

## 2022-07-09 DIAGNOSIS — N138 Other obstructive and reflux uropathy: Secondary | ICD-10-CM | POA: Diagnosis present

## 2022-07-09 DIAGNOSIS — E349 Endocrine disorder, unspecified: Secondary | ICD-10-CM | POA: Insufficient documentation

## 2022-07-09 DIAGNOSIS — N401 Enlarged prostate with lower urinary tract symptoms: Secondary | ICD-10-CM | POA: Diagnosis present

## 2022-07-09 LAB — HEMOGLOBIN AND HEMATOCRIT, BLOOD
HCT: 44.7 % (ref 39.0–52.0)
Hemoglobin: 14.7 g/dL (ref 13.0–17.0)

## 2022-07-09 LAB — PSA: Prostatic Specific Antigen: 0.34 ng/mL (ref 0.00–4.00)

## 2022-07-10 LAB — TESTOSTERONE: Testosterone: 60 ng/dL — ABNORMAL LOW (ref 264–916)

## 2022-07-14 ENCOUNTER — Encounter: Payer: Self-pay | Admitting: Urology

## 2022-07-14 ENCOUNTER — Ambulatory Visit: Payer: BC Managed Care – PPO | Admitting: Urology

## 2022-07-14 VITALS — BP 116/75 | HR 83 | Ht 71.0 in | Wt 182.6 lb

## 2022-07-14 DIAGNOSIS — E291 Testicular hypofunction: Secondary | ICD-10-CM

## 2022-07-14 DIAGNOSIS — N529 Male erectile dysfunction, unspecified: Secondary | ICD-10-CM | POA: Diagnosis not present

## 2022-07-14 DIAGNOSIS — N401 Enlarged prostate with lower urinary tract symptoms: Secondary | ICD-10-CM

## 2022-07-14 DIAGNOSIS — N138 Other obstructive and reflux uropathy: Secondary | ICD-10-CM | POA: Diagnosis not present

## 2022-07-14 MED ORDER — TESTOSTERONE CYPIONATE 200 MG/ML IM SOLN: INTRAMUSCULAR | 0 refills | Status: DC

## 2022-07-14 NOTE — Progress Notes (Signed)
07/14/22 1:47 PM   Jeffrey Cook. December 15, 1966 [id number]  Referring provider:  Barbaraann Boys, MD [place],  [place] [address]  Chief Complaint  Patient presents with   Testosterone deficiency    Urological history  1. High risk hematuria - Non-smoker.  - CTU 07/2019 Punctate 1-2 mm stone inferior pole right kidney.  No ureterolithiasis.  No hydronephrosis.  No suspicious enhancing renal masses.  No abnormal filling defects within the opacified renal collecting systems and ureters.  Pulmonary nodules measuring up to 4 mm. No follow-up needed if patient is low-risk (and has no known or suspected primary neoplasm). Non-contrast chest CT can be considered in 12 months if patient is high-risk. Hepatic steatosis.   -Cysto with Dr. Erlene Quan 07/2019 NED -No reports of gross heme -UA, 12/2021 - negative for micro heme  2. Testosterone deficiency -contributing factors of age and diabetes -testosterone 60 06/2022 -hemoglobin 14.7 06/2022 -HCT 44.7% 06/2022 -managed with testosterone cypionate 200 mg/cc, 0.5 cc q weekly   3. ED -contributing factors of age, BPH, testosterone deficiency, DM, HTN and HLD -SHIM 24 -managing with sildenafil 20 mg on demand dosing   4. BPH with LU TS -PSA 0.34 06/2022 -I PSS 4/1   HPI: Jeffrey Prehn. is a 56 y.o.male who presents today for a 6 months follow-up   He reports that he stopped taking his testosterone for 2 weeks which is why he has a low result.   He has no urinary complaints at this time.  Patient denies any modifying or aggravating factors.  Patient denies any gross hematuria, dysuria or suprapubic/flank pain.  Patient denies any fevers, chills, nausea or vomiting.     IPSS     Row Name 07/14/22 1300         International Prostate Symptom Score   How often have you had the sensation of not emptying your bladder? Not at All     How often have you had to urinate less than every two hours? Less than 1 in 5 times      How often have you found you stopped and started again several times when you urinated? Not at All     How often have you found it difficult to postpone urination? Not at All     How often have you had a weak urinary stream? Less than 1 in 5 times     How often have you had to strain to start urination? Not at All     How many times did you typically get up at night to urinate? 2 Times     Total IPSS Score 4       Quality of Life due to urinary symptoms   If you were to spend the rest of your life with your urinary condition just the way it is now how would you feel about that? Pleased              Score:  1-7 Mild 8-19 Moderate 20-35 Severe  Patient still having spontaneous erections.  He denies any pain or curvature with erections.     SHIM     Row Name 07/14/22 1319         SHIM: Over the last 6 months:   How do you rate your confidence that you could get and keep an erection? High     When you had erections with sexual stimulation, how often were your erections hard enough for penetration (entering your partner)? Almost Always or  Always     During sexual intercourse, how often were you able to maintain your erection after you had penetrated (entered) your partner? Almost Always or Always     During sexual intercourse, how difficult was it to maintain your erection to completion of intercourse? Not Difficult     When you attempted sexual intercourse, how often was it satisfactory for you? Almost Always or Always       SHIM Total Score   SHIM 24                PMH: Past Medical History:  Diagnosis Date   Allergic rhinitis    BPH with obstruction/lower urinary tract symptoms    Chronic headaches    Diabetes mellitus without complication (HCC)    Diverticulosis    Dyslipidemia    Erectile dysfunction    Fatty liver    GERD (gastroesophageal reflux disease)    Hypogonadism in male    Personal history of multiple concussions    Steatohepatitis, non-alcoholic      Surgical History: Past Surgical History:  Procedure Laterality Date   LIVER BIOPSY  06/10/2009   MYELOGRAM     NASAL SINUS SURGERY      Home Medications:  Allergies as of 07/14/2022       Reactions   Codeine    Fenofibrate Other (See Comments)   Fish Oil    Other reaction(s): Other (See Comments) Dyspepsia, foul taste, loose stools - tolerates Lovaza   Niacin    Other reaction(s): Liver Disorder He has a history of liver test elevations while taking niacin prior to 2006. No niacin trial since then. 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   Niacin And Related    Prednisone    Headache   Tricor [fenofibrate]         Medication List        Accurate as of July 14, 2022  1:47 PM. If you have any questions, ask your nurse or doctor.          aspirin 81 MG tablet Take 81 mg by mouth daily.   buPROPion 200 MG 12 hr tablet Commonly known as: WELLBUTRIN SR Take 1 tablet by mouth 2 (two) times daily.   cholecalciferol 10 MCG (400 UNIT) Tabs tablet Commonly known as: VITAMIN D3 Take 1,000 Units by mouth daily.   Dulaglutide 0.75 MG/0.5ML Sopn Inject into the skin.   fenofibrate 160 MG tablet Take 160 mg by mouth daily.   lisinopril 5 MG tablet Commonly known as: ZESTRIL Take 5 mg by mouth daily.   metFORMIN 500 MG 24 hr tablet Commonly known as: GLUCOPHAGE-XR Take 500 mg by mouth 3 (three) times daily.   Precision QID Test test strip Generic drug: glucose blood Use 1 each (1 strip total) once daily Use as instructed.   propranolol 10 MG tablet Commonly known as: INDERAL Take by mouth.   rosuvastatin 5 MG tablet Commonly known as: CRESTOR Take 1 tablet by mouth daily.   sildenafil 20 MG tablet Commonly known as: REVATIO Take 3 to 5 tablets two hours before intercouse on an empty stomach.  Do not take with nitrates.   SUMAtriptan 50 MG tablet Commonly known as: IMITREX Take by mouth.   testosterone cypionate 200 MG/ML  injection Commonly known as: DEPOTESTOSTERONE CYPIONATE ADMINISTER 0.5 ML IN THE MUSCLE EVERY WEEK   Vascepa 1 g capsule Generic drug: icosapent Ethyl TK 2 CS PO BID   vitamin B-12 1000 MCG  tablet Commonly known as: CYANOCOBALAMIN Take by mouth.   vitamin E 180 MG (400 UNITS) capsule Take 800 Units by mouth daily.        Allergies:  Allergies  Allergen Reactions   Codeine    Fenofibrate Other (See Comments)   Fish Oil     Other reaction(s): Other (See Comments) Dyspepsia, foul taste, loose stools - tolerates Lovaza   Niacin     Other reaction(s): Liver Disorder He has a history of liver test elevations while taking niacin prior to 2006. No niacin trial since then. 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   Niacin And Related    Prednisone     Headache   Tricor [Fenofibrate]     Family History: Family History  Problem Relation Age of Onset   Hypertension Mother    Alcohol abuse Mother    Diabetes Mother    Stroke Mother    Transient ischemic attack Father    Hyperlipidemia Father    Melanoma Father    Heart attack Father    Kidney cancer Neg Hx    Prostate cancer Neg Hx    Bladder Cancer Neg Hx     Social History:  reports that he has never smoked. He has never used smokeless tobacco. He reports current alcohol use. He reports that he does not use drugs.   Physical Exam: BP 116/75   Pulse 83   Ht 5' 11"  (1.803 m)   Wt 182 lb 9.6 oz (82.8 kg)   BMI 25.47 kg/m   Constitutional:  Alert and oriented, No acute distress. HEENT: [place] AT, moist mucus membranes.  Trachea midline Cardiovascular: No clubbing, cyanosis, or edema. Respiratory: Normal respiratory effort, no increased work of breathing. Neurologic: Grossly intact, no focal deficits, moving all 4 extremities. Psychiatric: Normal mood and affect.  Laboratory Data: Component     Latest Ref Rng 07/09/2022  Testosterone     264 - 916 ng/dL 60 (L)     Legend: (L) Low  Component      Latest Ref Rng 07/09/2022  Hemoglobin     13.0 - 17.0 g/dL 14.7   HCT     39.0 - 52.0 % 44.7     Component     Latest Ref Rng 07/09/2022  Prostatic Specific Antigen     0.00 - 4.00 ng/mL 0.34      Ref Range & Units 1 mo ago  Sodium 135 - 145 mmol/L 137   Potassium 3.5 - 5.0 mmol/L 4.2   Chloride 98 - 108 mmol/L 104   Carbon Dioxide (CO2) 21 - 30 mmol/L 26   Urea Nitrogen (BUN) 7 - 20 mg/dL 15   Creatinine 0.6 - 1.3 mg/dL 1.2   Glucose 70 - 140 mg/dL 100   Comment: Interpretive Data:  Above is the NONFASTING reference range.   Below are the FASTING reference ranges:  NORMAL:      70-99 mg/dL  PREDIABETES: 100-125 mg/dL  DIABETES:    > 125 mg/dL   Calcium 8.7 - 10.2 mg/dL 9.1   AST (Aspartate Aminotransferase) 15 - 41 U/L 23   ALT (Alanine Aminotransferase) 15 - 50 U/L 17   Bilirubin, Total 0.4 - 1.5 mg/dL 0.5   Alk Phos (Alkaline Phosphatase) 24 - 110 U/L 35   Albumin 3.5 - 4.8 g/dL 4.5   Protein, Total 6.2 - 8.1 g/dL 7.3   Anion Gap 3 - 12 mmol/L 7   BUN/CREA Ratio 6 - 27 13  Glomerular Filtration Rate (eGFR)  mL/min/1.73sq m 71   I have reviewed the labs.   Assessment & Plan:    1. Testosterone deficiency  -Testosterone level is low although he stopped taking his testosterone cypionate  -Continue testosterone cypionate 200 mg/cc, 0.5 cc q week - patient will continue self injection but reduce it to 0.4 cc q week  -refill given for testosterone cypionate -testosterone level, H & H in 3 months    2. BPH with LUTS -PSA stable -symptoms - mild weakening of the stream  -continue conservative management, avoiding bladder irritants and timed voiding's   3. Erectile dysfunction:    -continue sildenafil 20 mg   4. High risk hematuria  -hematuria work up completed in 07/2019 - NED -No report of gross hematuria   Return for testosterone level, HCT and HGB in 3 months on a wednesday .   [facility] [address] [place], [place] [address] [phone]  I, Kirke Shaggy Littlejohn,acting as a scribe for [facility], PA-C.,have documented all relevant documentation on the behalf of Jeffrey Herrod, PA-C,as directed by  [facility], PA-C while in the presence of [place], PA-C.  I have reviewed the above documentation for accuracy and completeness, and I agree with the above.    Zara Council, PA-C

## 2022-08-22 ENCOUNTER — Ambulatory Visit
Admission: EM | Admit: 2022-08-22 | Discharge: 2022-08-22 | Disposition: A | Discharge status: Home / Self Care | Payer: BC Managed Care – PPO | Attending: Emergency Medicine | Admitting: Emergency Medicine

## 2022-08-22 ENCOUNTER — Ambulatory Visit (INDEPENDENT_AMBULATORY_CARE_PROVIDER_SITE_OTHER): Payer: BC Managed Care – PPO

## 2022-08-22 DIAGNOSIS — N451 Epididymitis: Secondary | ICD-10-CM | POA: Diagnosis not present

## 2022-08-22 DIAGNOSIS — N410 Acute prostatitis: Secondary | ICD-10-CM | POA: Insufficient documentation

## 2022-08-22 DIAGNOSIS — R109 Unspecified abdominal pain: Secondary | ICD-10-CM

## 2022-08-22 DIAGNOSIS — K5900 Constipation, unspecified: Secondary | ICD-10-CM | POA: Diagnosis not present

## 2022-08-22 LAB — URINALYSIS, ROUTINE W REFLEX MICROSCOPIC
Bilirubin Urine: NEGATIVE
Glucose, UA: NEGATIVE mg/dL
Hgb urine dipstick: NEGATIVE
Ketones, ur: NEGATIVE mg/dL
Leukocytes,Ua: NEGATIVE
Nitrite: NEGATIVE
Protein, ur: NEGATIVE mg/dL
Specific Gravity, Urine: 1.025 (ref 1.005–1.030)
pH: 5.5 (ref 5.0–8.0)

## 2022-08-22 MED ORDER — CIPROFLOXACIN HCL 500 MG PO TABS: 500.0000 mg | ORAL_TABLET | Freq: Two times a day (BID) | ORAL | 0 refills | Status: AC

## 2022-08-22 NOTE — ED Triage Notes (Signed)
Pt c/o lower back pain x2-3weeks.  Pt states that he has a history of kidney stones.  Pt states that the pain travelled from the back to his left lower abdomen.   Pt began to have testicular pain whenever he had a "full bowel". Pt describes this as after eating a full sized meal after an hour and feels like something is ballooning in size and gives the sensation of needing to urinate.   Pt states that the pain today is at the groin and pelvic floor and radiates through the penis that intensifies when going through the penis toward the tip.  Pt believes he has a UTI and compares the pain to having a camera inserted into the tip of his penis to look for kidney stones.

## 2022-08-22 NOTE — ED Provider Notes (Signed)
[facility]    CSN: [id number] Arrival date & time: 08/22/22  0347      History   Chief Complaint Chief Complaint  Patient presents with   Urinary Tract Infection    HPI Akhil Piscopo. is a 56 y.o. male.   HPI  56 year old male here for evaluation of low back pain.  Patient reports that he has been experiencing pain in the left side of his low back for the last 2 to 3 weeks that wraps around to his left lower quadrant of his abdomen.  He states that those symptoms have subsided but they still remain.  Additionally, he was having pain radiating into his left testicle whenever he ate a meal and his belly was full.  This would improve after having a bowel movement.  He states that he is now having pain behind his scrotum and his perineum that radiates through the penis.  He is also having stinging from the glans penis to the urethral meatus and burning after urination.  He has had frequency of urination but denies any urgency.  He states his stream may be slightly weaker but is not sure.  He did notice a pink tinge to his urine 3 weeks ago but that has resolved.  He denies any fever, nausea, vomiting, or sweating.  He also denies any penile discharge.  He has had an prostatitis in the past as well as kidney stones.  He states his pain is lower and feels different than his last kidney stone.  Past Medical History:  Diagnosis Date   Allergic rhinitis    BPH with obstruction/lower urinary tract symptoms    Chronic headaches    Diabetes mellitus without complication (HCC)    Diverticulosis    Dyslipidemia    Erectile dysfunction    Fatty liver    GERD (gastroesophageal reflux disease)    Hypogonadism in male    Personal history of multiple concussions    Steatohepatitis, non-alcoholic     Patient Active Problem List   Diagnosis Date Noted   Chronic prostatitis 05/28/2018   Abnormal ECG 05/04/2018   Chronic pain of left knee 10/08/2017   Impingement syndrome of  left shoulder 10/08/2017   Numbness and tingling of upper extremity 10/08/2017   Migraine without aura and without status migrainosus, not intractable 09/23/2017   Erectile dysfunction of non-organic origin 04/14/2016   Non-seasonal allergic rhinitis 03/23/2016   Hypogonadism in male 10/17/2015   BPH with obstruction/lower urinary tract symptoms 10/17/2015   Balanitis 10/17/2015   Polypharmacy 04/18/2015   Encounter for long-term (current) use of medications 04/18/2015   Muscle ache 10/13/2014   Avitaminosis D 03/29/2014   Abscess or cellulitis of leg 11/08/2012   Fatty liver disease, nonalcoholic 01/07/2012   Acid reflux 01/07/2012   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 Authorizing Provider  aspirin 81 MG tablet Take 81 mg by mouth daily.   Yes [provider]  buPROPion (WELLBUTRIN SR) 200 MG 12 hr tablet Take 1 tablet by mouth 2 (two) times daily. 12/03/21 12/03/22 Yes [provider]  cholecalciferol (VITAMIN D) 400 UNITS TABS tablet Take 1,000 Units  by mouth daily.   Yes [provider]  ciprofloxacin (CIPRO) 500 MG tablet Take 1 tablet (500 mg total) by mouth 2 (two) times daily for 14 days. 08/22/22 09/05/22 Yes Becky Augusta, NP  Dulaglutide 0.75 MG/0.5ML SOPN Inject into the skin. 06/18/22 12/03/22 Yes [provider]  fenofibrate 160 MG tablet Take 160 mg by mouth daily.   Yes [provider]  lisinopril (PRINIVIL,ZESTRIL) 5 MG tablet Take 5 mg by mouth daily.   Yes [provider]  metFORMIN (GLUCOPHAGE-XR) 500 MG 24 hr tablet Take 500 mg by mouth 3 (three) times daily. 12/05/20  Yes [provider]  propranolol  (INDERAL) 10 MG tablet Take by mouth. 09/23/17  Yes [provider]  rosuvastatin (CRESTOR) 5 MG tablet Take 1 tablet by mouth daily. 06/08/21  Yes [provider]  sildenafil (REVATIO) 20 MG tablet Take 3 to 5 tablets two hours before intercouse on an empty stomach.  Do not take with nitrates. 07/15/21  Yes McGowan, Carollee Herter A, PA-C  SUMAtriptan (IMITREX) 50 MG tablet Take by mouth. 11/28/20  Yes [provider]  testosterone cypionate (DEPOTESTOSTERONE CYPIONATE) 200 MG/ML injection ADMINISTER 0.5 ML IN THE MUSCLE EVERY WEEK 07/14/22  Yes McGowan, Carollee Herter A, PA-C  VASCEPA 1 g CAPS TK 2 CS PO BID 09/26/17  Yes [provider]  vitamin B-12 (CYANOCOBALAMIN) 1000 MCG tablet Take by mouth.   Yes [provider]  vitamin E 400 UNIT capsule Take 800 Units by mouth daily.   Yes [provider]  glucose blood (PRECISION QID TEST) test strip Use 1 each (1 strip total) once daily Use as instructed. 11/04/21 11/04/22  [provider]    Family History Family History  Problem Relation Age of Onset   Hypertension Mother    Alcohol abuse Mother    Diabetes Mother    Stroke Mother    Transient ischemic attack Father    Hyperlipidemia Father    Melanoma Father    Heart attack Father    Kidney cancer Neg Hx    Prostate cancer Neg Hx    Bladder Cancer Neg Hx     Social History Social History   Tobacco Use   Smoking status: Never   Smokeless tobacco: Former  Building services engineer Use: Never used  Substance Use Topics   Alcohol use: Yes    Alcohol/week: 0.0 standard drinks of alcohol    Comment: occasional   Drug use: No     Allergies   Codeine, Fenofibrate, Fish oil, Niacin, Niacin and related, Prednisone, and Tricor [fenofibrate]   Review of Systems Review of Systems  Constitutional:  Negative for fever.  Gastrointestinal:  Positive for abdominal pain. Negative for constipation, diarrhea, nausea and vomiting.  Genitourinary:   Positive for difficulty urinating, dysuria, frequency, penile pain and testicular pain. Negative for penile discharge, penile swelling, scrotal swelling and urgency.  Musculoskeletal:  Positive for back pain.  Skin:  Negative for rash.  Hematological: Negative.   Psychiatric/Behavioral: Negative.       Physical Exam Triage Vital Signs ED Triage Vitals  Enc Vitals Group     BP 08/22/22 0959 (!) 130/98     Pulse Rate 08/22/22 0959 82     Resp 08/22/22 0959 18     Temp 08/22/22 0959 98.6 F (37 C)     Temp Source 08/22/22 0959 Oral     SpO2 08/22/22 0959 99 %     Weight 08/22/22 0957 178 lb (80.7 kg)  Height 08/22/22 0957 5\' 11"  (1.803 m)     Head Circumference --      Peak Flow --      Pain Score 08/22/22 0954 8     Pain Loc --      Pain Edu? --      Excl. in GC? --    No data found.  Updated Vital Signs BP (!) 130/98 (BP Location: Left Arm)   Pulse 82   Temp 98.6 F (37 C) (Oral)   Resp 18   Ht 5\' 11"  (1.803 m)   Wt 178 lb (80.7 kg)   SpO2 99%   BMI 24.83 kg/m   Visual Acuity Right Eye Distance:   Left Eye Distance:   Bilateral Distance:    Right Eye Near:   Left Eye Near:    Bilateral Near:     Physical Exam Vitals and nursing note reviewed.  Constitutional:      Appearance: Normal appearance. He is not ill-appearing.  HENT:     Head: Normocephalic and atraumatic.  Cardiovascular:     Rate and Rhythm: Normal rate and regular rhythm.     Pulses: Normal pulses.     Heart sounds: Normal heart sounds. No murmur heard.    No friction rub. No gallop.  Pulmonary:     Effort: Pulmonary effort is normal.     Breath sounds: Normal breath sounds. No wheezing, rhonchi or rales.  Abdominal:     General: Abdomen is flat.     Palpations: Abdomen is soft.     Tenderness: There is abdominal tenderness. There is no right CVA tenderness, left CVA tenderness, guarding or rebound.  Skin:    General: Skin is warm and dry.     Capillary Refill: Capillary refill  takes less than 2 seconds.     Findings: No erythema or rash.  Neurological:     General: No focal deficit present.     Mental Status: He is alert and oriented to person, place, and time.  Psychiatric:        Mood and Affect: Mood normal.        Behavior: Behavior normal.        Thought Content: Thought content normal.        Judgment: Judgment normal.      UC Treatments / Results  Labs (all labs ordered are listed, but only abnormal results are displayed) Labs Reviewed  URINALYSIS, ROUTINE W REFLEX MICROSCOPIC    EKG   Radiology DG Abdomen 1 View  Result Date: 08/22/2022 CLINICAL DATA:  56 year old male with LEFT flank pain radiating to the abdomen. EXAM: ABDOMEN - 1 VIEW COMPARISON:  CT acquired in August of 2020 FINDINGS: No abnormal calcifications over the LEFT or RIGHT renal contour. Bowel gas pattern with mild-to-moderate stool burden, no signs of bowel distension/obstruction. Soft tissue contours are unremarkable. Stool and gas is present to the level of the rectum. IMPRESSION: 1. No abnormal calcifications over the LEFT or RIGHT renal contour. 2. Mild-to-moderate stool burden without signs of obstruction. Electronically Signed   By: 57 M.D.   On: 08/22/2022 10:44    Procedures Procedures (including critical care time)  Medications Ordered in UC Medications - No data to display  Initial Impression / Assessment and Plan / UC Course  I have reviewed the triage vital signs and the nursing notes.  Pertinent labs & imaging results that were available during my care of the patient were reviewed by me and considered in my  medical decision making (see chart for details).   Patient is a nontoxic-appearing 56 year old male here for evaluation of urinary abdominal complaints as outlined in HPI above.  His physical exam reveals S1-S2 heart sounds with regular rate and rhythm and lung sounds that are clear to auscultation all fields.  No CVA tenderness on exam.  Abdomen  is soft, flat, with left lower quadrant tenderness.  There is also some mild right inguinal tenderness.  No guarding or rebound.  Genital exam reveals no discharge from the urethral meatus and the glans penis and penile shaft are free of lesions, swelling, ecchymosis, or erythema.  Both testicles are smooth and nontender.  The epididymis complex on the right is mildly swollen and slightly tender to touch.  The left abdomen is complex is normal.  No evidence of hernia.  Differential diagnosis include urinary tract infection, prostatitis, and renal stone.  Urinalysis was collected at triage.  I will order a KUB to look for the presence of a kidney stone.  Patient's urinalysis does not demonstrate the presence of any hemoglobin, protein, nitrites, or leukocyte esterase.  KUB independently reviewed and evaluated by me.  Impression: Patient has a large stool burden in the ascending and descending colon.  There is nothing present in the rectum.  Nonspecific air-fluid levels.  No evidence of renal calculi noted.  Radiology overread is pending. Radiology impression states that there are no abnormal calcifications over the left or right renal contour.  Mild to moderate stool burden without signs of obstruction.  I will treat patient for prostatitis with Cipro twice daily for 14 days.  We will also cover for possible epididymitis given the inflamed epididymis complex on the left.  I will have patient begin taking MiraLAX daily to help with constipation.   Final Clinical Impressions(s) / UC Diagnoses   Final diagnoses:  Acute prostatitis  Epididymitis, right  Constipation, unspecified constipation type     Discharge Instructions      Your urine does not show any signs of infection and there is no evidence of a kidney stone on your x-ray.  Your x-ray does show that you have significant amount of stool on both sides of your colon indicating that you have constipation which may be causing some of your pain  in your abdomen and your back.  I want you to take MiraLAX daily.  1 capful in 8 ounces of a beverage of your choice.  Continue this until you have regular bowel movements and your symptoms have improved.  Muscular treat you for prostatitis and epididymitis given the pain and swelling that you are having in your genital region.  Take the Cipro twice daily with food for 14 days for treatment of both.  Return for reevaluation, or see your PCP, for continued or worsening symptoms.     ED Prescriptions     Medication Sig Dispense Auth. Provider   ciprofloxacin (CIPRO) 500 MG tablet Take 1 tablet (500 mg total) by mouth 2 (two) times daily for 14 days. 28 tablet Becky Augusta, NP      PDMP not reviewed this encounter.   Becky Augusta, NP 08/22/22 1059

## 2022-08-22 NOTE — Discharge Instructions (Addendum)
Your urine does not show any signs of infection and there is no evidence of a kidney stone on your x-ray.  Your x-ray does show that you have significant amount of stool on both sides of your colon indicating that you have constipation which may be causing some of your pain in your abdomen and your back.  I want you to take MiraLAX daily.  1 capful in 8 ounces of a beverage of your choice.  Continue this until you have regular bowel movements and your symptoms have improved.  Muscular treat you for prostatitis and epididymitis given the pain and swelling that you are having in your genital region.  Take the Cipro twice daily with food for 14 days for treatment of both.  Return for reevaluation, or see your PCP, for continued or worsening symptoms.

## 2022-10-06 ENCOUNTER — Other Ambulatory Visit: Payer: Self-pay

## 2022-10-06 ENCOUNTER — Ambulatory Visit: Payer: BC Managed Care – PPO | Admitting: Urology

## 2022-10-06 DIAGNOSIS — N529 Male erectile dysfunction, unspecified: Secondary | ICD-10-CM

## 2022-10-06 DIAGNOSIS — E349 Endocrine disorder, unspecified: Secondary | ICD-10-CM

## 2022-10-06 DIAGNOSIS — N401 Enlarged prostate with lower urinary tract symptoms: Secondary | ICD-10-CM

## 2022-10-06 DIAGNOSIS — E291 Testicular hypofunction: Secondary | ICD-10-CM

## 2022-10-09 ENCOUNTER — Other Ambulatory Visit
Admission: RE | Admit: 2022-10-09 | Discharge: 2022-10-09 | Disposition: A | Discharge status: Home / Self Care | Payer: BC Managed Care – PPO | Attending: Urology | Admitting: Urology

## 2022-10-09 DIAGNOSIS — N529 Male erectile dysfunction, unspecified: Secondary | ICD-10-CM

## 2022-10-09 DIAGNOSIS — E349 Endocrine disorder, unspecified: Secondary | ICD-10-CM

## 2022-10-09 DIAGNOSIS — N138 Other obstructive and reflux uropathy: Secondary | ICD-10-CM | POA: Diagnosis present

## 2022-10-09 DIAGNOSIS — N401 Enlarged prostate with lower urinary tract symptoms: Secondary | ICD-10-CM | POA: Insufficient documentation

## 2022-10-09 DIAGNOSIS — E291 Testicular hypofunction: Secondary | ICD-10-CM

## 2022-10-09 LAB — HEMOGLOBIN AND HEMATOCRIT, BLOOD
HCT: 45.4 % (ref 39.0–52.0)
Hemoglobin: 15.1 g/dL (ref 13.0–17.0)

## 2022-10-10 LAB — TESTOSTERONE: Testosterone: 717 ng/dL (ref 264–916)

## 2023-01-08 ENCOUNTER — Other Ambulatory Visit: Payer: Self-pay | Admitting: Urology

## 2023-01-08 ENCOUNTER — Other Ambulatory Visit
Admission: RE | Admit: 2023-01-08 | Discharge: 2023-01-08 | Disposition: A | Discharge status: Home / Self Care | Payer: BC Managed Care – PPO | Attending: Urology | Admitting: Urology

## 2023-01-08 DIAGNOSIS — E291 Testicular hypofunction: Secondary | ICD-10-CM

## 2023-01-08 DIAGNOSIS — N138 Other obstructive and reflux uropathy: Secondary | ICD-10-CM | POA: Diagnosis present

## 2023-01-08 DIAGNOSIS — N401 Enlarged prostate with lower urinary tract symptoms: Secondary | ICD-10-CM | POA: Insufficient documentation

## 2023-01-08 LAB — HEMOGLOBIN AND HEMATOCRIT, BLOOD
HCT: 43.4 % (ref 39.0–52.0)
Hemoglobin: 14.2 g/dL (ref 13.0–17.0)

## 2023-01-08 LAB — PSA: Prostatic Specific Antigen: 0.29 ng/mL (ref 0.00–4.00)

## 2023-01-09 LAB — TESTOSTERONE: Testosterone: 886 ng/dL (ref 264–916)

## 2023-01-10 NOTE — Progress Notes (Signed)
01/12/23 1:51 PM   Jeffrey Cook. 1966/03/01 [id number]  Referring provider:  Orene Desanctis, MD [address] [place],  [place] [address]  Urological history  1. High risk hematuria - Non-smoker.  - CTU 07/2019 Punctate 1-2 mm stone inferior pole right kidney.  No ureterolithiasis.  No hydronephrosis.  No suspicious enhancing renal masses.  No abnormal filling defects within the opacified renal collecting systems and ureters.  Pulmonary nodules measuring up to 4 mm. No follow-up needed if patient is low-risk (and has no known or suspected primary neoplasm). Non-contrast chest CT can be considered in 12 months if patient is high-risk. Hepatic steatosis.   -Cysto with Dr. Apolinar Junes 07/2019 NED -No reports of gross heme -UA (08/2022)- negative for micro heme  2. Testosterone deficiency -contributing factors of age and diabetes -testosterone (12/2022) 886 -hemoglobin/HCT (12/2021) 14.2/43.4 -testosterone cypionate 200 mg/cc, 0.5 cc q weekly   3. ED -contributing factors of age, BPH, testosterone deficiency, DM, HTN and HLD -SHIM 25 -sildenafil 20 mg on demand dosing   4. BPH with LU TS -PSA (12/2022) 0.29 -I PSS 5/2   HPI: Jeffrey Cook. is a 57 y.o.male who presents today for a 6 months follow-up.  He is not having urinary issues at this time.   Patient denies any modifying or aggravating factors.  Patient denies any gross hematuria, dysuria or suprapubic/flank pain.  Patient denies any fevers, chills, nausea or vomiting.     IPSS     Row Name 01/12/23 1300         International Prostate Symptom Score   How often have you had the sensation of not emptying your bladder? Not at All     How often have you had to urinate less than every two hours? Less than half the time     How often have you found you stopped and started again several times when you urinated? Less than 1 in 5 times     How often have you found it difficult to postpone urination? Not at All     How  often have you had a weak urinary stream? Less than 1 in 5 times     How often have you had to strain to start urination? Not at All     How many times did you typically get up at night to urinate? 1 Time     Total IPSS Score 5       Quality of Life due to urinary symptoms   If you were to spend the rest of your life with your urinary condition just the way it is now how would you feel about that? Mostly Satisfied               Score:  1-7 Mild 8-19 Moderate 20-35 Severe  SHIM 25  Patient still having spontaneous erections.  He denies any pain or curvature with erections.     SHIM     Row Name 01/12/23 1326         SHIM: Over the last 6 months:   How do you rate your confidence that you could get and keep an erection? Very High     When you had erections with sexual stimulation, how often were your erections hard enough for penetration (entering your partner)? Almost Always or Always     During sexual intercourse, how often were you able to maintain your erection after you had penetrated (entered) your partner? Almost Always or Always     During  sexual intercourse, how difficult was it to maintain your erection to completion of intercourse? Not Difficult     When you attempted sexual intercourse, how often was it satisfactory for you? Almost Always or Always       SHIM Total Score   SHIM 25                 PMH: Past Medical History:  Diagnosis Date   Allergic rhinitis    BPH with obstruction/lower urinary tract symptoms    Chronic headaches    Diabetes mellitus without complication (HCC)    Diverticulosis    Dyslipidemia    Erectile dysfunction    Fatty liver    GERD (gastroesophageal reflux disease)    Hypogonadism in male    Personal history of multiple concussions    Steatohepatitis, non-alcoholic     Surgical History: Past Surgical History:  Procedure Laterality Date   LIVER BIOPSY  06/10/2009   MYELOGRAM     NASAL SINUS SURGERY      Home  Medications:  Allergies as of 01/12/2023       Reactions   Codeine    Fenofibrate Other (See Comments)   Fish Oil    Other reaction(s): Other (See Comments) Dyspepsia, foul taste, loose stools - tolerates Lovaza   Niacin    Other reaction(s): Liver Disorder He has a history of liver test elevations while taking niacin prior to 2006. No niacin trial since then. 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   Niacin And Related    Prednisone    Headache   Tricor [fenofibrate]         Medication List        Accurate as of January 12, 2023  1:51 PM. If you have any questions, ask your nurse or doctor.          2-3CC SYRINGE 3 ML Misc 1 mg by Does not apply route every 14 (fourteen) days. Started by: Zara Council, PA-C   aspirin 81 MG tablet Take 81 mg by mouth daily.   BD Disp Needles 18G X 1-1/2" Misc Generic drug: NEEDLE (DISP) 18 G 1 mg by Does not apply route every 14 (fourteen) days. Started by: Zara Council, PA-C   BD Disp Needles 21G X 1-1/2" Misc Generic drug: NEEDLE (DISP) 21 G 1 mg by Does not apply route every 14 (fourteen) days. Started by: Zara Council, PA-C   cholecalciferol 10 MCG (400 UNIT) Tabs tablet Commonly known as: VITAMIN D3 Take 1,000 Units by mouth daily.   cyanocobalamin 1000 MCG tablet Commonly known as: VITAMIN B12 Take by mouth.   Dulaglutide 0.75 MG/0.5ML Sopn Inject into the skin.   fenofibrate 160 MG tablet Take 160 mg by mouth daily.   FreeStyle Libre 3 Sensor Misc USE AS DIRECTED EVERY 14 DAYS   lisinopril 5 MG tablet Commonly known as: ZESTRIL Take 5 mg by mouth daily.   metFORMIN 500 MG 24 hr tablet Commonly known as: GLUCOPHAGE-XR Take by mouth. What changed: Another medication with the same name was removed. Continue taking this medication, and follow the directions you see here. Changed by: Zara Council, PA-C   propranolol 10 MG tablet Commonly known as: INDERAL Take by mouth.    rosuvastatin 10 MG tablet Commonly known as: CRESTOR Take 1 tablet by mouth daily. What changed: Another medication with the same name was removed. Continue taking this medication, and follow the directions you see here. Changed by: Zara Council, PA-C  sildenafil 20 MG tablet Commonly known as: REVATIO Take 3 to 5 tablets two hours before intercouse on an empty stomach.  Do not take with nitrates.   SUMAtriptan 50 MG tablet Commonly known as: IMITREX Take by mouth.   testosterone cypionate 200 MG/ML injection Commonly known as: DEPOTESTOSTERONE CYPIONATE ADMINISTER 0.5 ML IN THE MUSCLE EVERY WEEK   Vascepa 1 g capsule Generic drug: icosapent Ethyl TK 2 CS PO BID   vitamin E 180 MG (400 UNITS) capsule Take 800 Units by mouth daily.        Allergies:  Allergies  Allergen Reactions   Codeine    Fenofibrate Other (See Comments)   Fish Oil     Other reaction(s): Other (See Comments) Dyspepsia, foul taste, loose stools - tolerates Lovaza   Niacin     Other reaction(s): Liver Disorder He has a history of liver test elevations while taking niacin prior to 2006. No niacin trial since then. 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   Niacin And Related    Prednisone     Headache   Tricor [Fenofibrate]     Family History: Family History  Problem Relation Age of Onset   Hypertension Mother    Alcohol abuse Mother    Diabetes Mother    Stroke Mother    Transient ischemic attack Father    Hyperlipidemia Father    Melanoma Father    Heart attack Father    Kidney cancer Neg Hx    Prostate cancer Neg Hx    Bladder Cancer Neg Hx     Social History:  reports that he has never smoked. He has never been exposed to tobacco smoke. He has quit using smokeless tobacco. He reports current alcohol use. He reports that he does not use drugs.   Physical Exam: BP 121/84   Pulse 71   Ht 5\' 11"  (1.803 m)   Wt 183 lb (83 kg)   BMI 25.52 kg/m   Constitutional:   Well nourished. Alert and oriented, No acute distress. HEENT: [place] AT, moist mucus membranes.  Trachea midline Cardiovascular: No clubbing, cyanosis, or edema. Respiratory: Normal respiratory effort, no increased work of breathing. GU: No CVA tenderness.  No bladder fullness or masses.  Patient with circumcised phallus.  Urethral meatus is patent.  No penile discharge. No penile lesions or rashes. Scrotum without lesions, cysts, rashes and/or edema.  Testicles are located scrotally bilaterally. No masses are appreciated in the testicles. Left and right epididymis are normal. Rectal: Patient with  normal sphincter tone. Anus and perineum without scarring or rashes. No rectal masses are appreciated. Prostate is approximately 45 grams, no nodules are appreciated. Seminal vesicles could not be palpated Neurologic: Grossly intact, no focal deficits, moving all 4 extremities. Psychiatric: Normal mood and affect.   Laboratory Data: Hemoglobin A1c (12/2022) 6.6 Total cholesterol (12/2022) 175 Serum creatinine (12/2022) 1.2 I have reviewed the labs.   Pertinent Imaging N/A  Assessment & Plan:    1. Testosterone deficiency  -Testosterone level is therapeutic -Continue testosterone cypionate 200 mg/cc, 0.5 cc q week - patient will continue self injection but reduce it to 0.4 cc q week  -testosterone level, H & H in 3 months    2. BPH with LUTS -PSA stable -symptoms - mild weakening of the stream  -continue conservative management, avoiding bladder irritants and timed voiding's -PSA 3 months   3. Erectile dysfunction:    -continue sildenafil 20 mg   4. High risk hematuria  -  hematuria work up completed in 07/2019 - NED -No report of gross hematuria   Return in about 3 months (around 04/13/2023) for PSA, testosterone and [facility]   [facility] [address], [place], [place] [address] [phone]

## 2023-01-12 ENCOUNTER — Encounter: Payer: Self-pay | Admitting: Urology

## 2023-01-12 ENCOUNTER — Ambulatory Visit: Payer: BC Managed Care – PPO | Admitting: Urology

## 2023-01-12 VITALS — BP 121/84 | HR 71 | Ht 71.0 in | Wt 183.0 lb

## 2023-01-12 DIAGNOSIS — N529 Male erectile dysfunction, unspecified: Secondary | ICD-10-CM

## 2023-01-12 DIAGNOSIS — E349 Endocrine disorder, unspecified: Secondary | ICD-10-CM

## 2023-01-12 DIAGNOSIS — E291 Testicular hypofunction: Secondary | ICD-10-CM

## 2023-01-12 DIAGNOSIS — R319 Hematuria, unspecified: Secondary | ICD-10-CM

## 2023-01-12 DIAGNOSIS — N401 Enlarged prostate with lower urinary tract symptoms: Secondary | ICD-10-CM

## 2023-01-12 DIAGNOSIS — N138 Other obstructive and reflux uropathy: Secondary | ICD-10-CM | POA: Diagnosis not present

## 2023-01-12 MED ORDER — SYRINGE 2-3 ML 3 ML MISC: 1.0000 mg | 3 refills | Status: DC

## 2023-01-12 MED ORDER — TESTOSTERONE CYPIONATE 200 MG/ML IM SOLN: INTRAMUSCULAR | 0 refills | Status: DC

## 2023-01-12 MED ORDER — "BD DISP NEEDLES 21G X 1-1/2"" MISC": 1.0000 mg | 0 refills | Status: DC

## 2023-01-12 MED ORDER — "BD DISP NEEDLES 18G X 1-1/2"" MISC": 1.0000 mg | 0 refills | Status: DC

## 2023-04-23 ENCOUNTER — Other Ambulatory Visit
Admission: RE | Admit: 2023-04-23 | Discharge: 2023-04-23 | Disposition: A | Discharge status: Home / Self Care | Payer: BC Managed Care – PPO | Attending: Urology | Admitting: Urology

## 2023-04-23 DIAGNOSIS — E291 Testicular hypofunction: Secondary | ICD-10-CM

## 2023-04-23 DIAGNOSIS — N529 Male erectile dysfunction, unspecified: Secondary | ICD-10-CM | POA: Diagnosis present

## 2023-04-23 DIAGNOSIS — E349 Endocrine disorder, unspecified: Secondary | ICD-10-CM

## 2023-04-23 DIAGNOSIS — N138 Other obstructive and reflux uropathy: Secondary | ICD-10-CM | POA: Diagnosis present

## 2023-04-23 DIAGNOSIS — N401 Enlarged prostate with lower urinary tract symptoms: Secondary | ICD-10-CM | POA: Insufficient documentation

## 2023-04-23 DIAGNOSIS — R319 Hematuria, unspecified: Secondary | ICD-10-CM | POA: Insufficient documentation

## 2023-04-23 LAB — HEMOGLOBIN AND HEMATOCRIT, BLOOD
HCT: 46.6 % (ref 39.0–52.0)
Hemoglobin: 15 g/dL (ref 13.0–17.0)

## 2023-04-25 LAB — TESTOSTERONE: Testosterone: 764 ng/dL (ref 264–916)

## 2023-04-26 ENCOUNTER — Encounter: Payer: Self-pay | Admitting: Urology

## 2023-04-26 ENCOUNTER — Other Ambulatory Visit: Payer: Self-pay | Admitting: Urology

## 2023-04-26 DIAGNOSIS — E349 Endocrine disorder, unspecified: Secondary | ICD-10-CM

## 2023-04-26 DIAGNOSIS — E291 Testicular hypofunction: Secondary | ICD-10-CM

## 2023-04-26 MED ORDER — BD DISP NEEDLES 21G X 1-1/2" MISC
1.0000 mg | 0 refills | Status: DC
Start: 2023-04-26 — End: 2024-05-02

## 2023-04-26 MED ORDER — TESTOSTERONE CYPIONATE 200 MG/ML IM SOLN: INTRAMUSCULAR | 0 refills | Status: DC

## 2023-04-26 MED ORDER — SYRINGE 2-3 ML 3 ML MISC: 1.0000 mg | 3 refills | Status: DC

## 2023-04-26 MED ORDER — "BD DISP NEEDLES 18G X 1-1/2"" MISC": 1.0000 mg | 0 refills | Status: DC

## 2023-04-27 ENCOUNTER — Telehealth: Payer: Self-pay | Admitting: Family Medicine

## 2023-04-27 ENCOUNTER — Other Ambulatory Visit: Payer: Self-pay | Admitting: Family Medicine

## 2023-04-27 DIAGNOSIS — N401 Enlarged prostate with lower urinary tract symptoms: Secondary | ICD-10-CM

## 2023-04-27 DIAGNOSIS — E291 Testicular hypofunction: Secondary | ICD-10-CM

## 2023-04-27 NOTE — Telephone Encounter (Signed)
Harle Battiest, PA-C sent to Honor Loh, CMA His labs look good.  He will need a follow up in three months for labs and office visit.  He will need PSA, testosterone, hemoglobin and hematocrit orders placed because he gets his labs drawn in [place].

## 2023-06-02 ENCOUNTER — Ambulatory Visit
Admission: RE | Admit: 2023-06-02 | Discharge: 2023-06-02 | Disposition: A | Discharge status: Home / Self Care | Payer: BC Managed Care – PPO | Source: Ambulatory Visit | Attending: Emergency Medicine | Admitting: Emergency Medicine

## 2023-06-02 VITALS — BP 126/90 | HR 85 | Temp 98.0°F

## 2023-06-02 DIAGNOSIS — H6691 Otitis media, unspecified, right ear: Secondary | ICD-10-CM

## 2023-06-02 DIAGNOSIS — U071 COVID-19: Secondary | ICD-10-CM

## 2023-06-02 LAB — SARS CORONAVIRUS 2 BY RT PCR: SARS Coronavirus 2 by RT PCR: POSITIVE — AB

## 2023-06-02 MED ORDER — AMOXICILLIN-POT CLAVULANATE 875-125 MG PO TABS: 1.0000 | ORAL_TABLET | Freq: Two times a day (BID) | ORAL | 0 refills | Status: DC

## 2023-06-02 NOTE — Discharge Instructions (Addendum)
You have tested positive for COVID. 5 days of wearing mask, no quarantine required. Take antibiotic as directed for ear infection, drink plenty of fluids. Return as needed.

## 2023-06-02 NOTE — ED Triage Notes (Addendum)
Pt presents to UC c/o nasal congestion, sinus pressure onset x3 days ago while [place]. Pt requesting covid test before returning to work.

## 2023-06-02 NOTE — ED Provider Notes (Signed)
[facility]    CSN: [id number] Arrival date & time: 06/02/23  0857      History   Chief Complaint Chief Complaint  Patient presents with   Nasal Congestion   Cough   Otalgia    RT ear    HPI Jeffrey Taub. is a 57 y.o. male.   57 year old male pt, [name], [name] , presents to urgent care chief complaint of nasal congestion sinus pressure x 3 days, right ear pain/pressure, while overseas traveling recently.  Patient requesting COVID test before returning to work.  The history is provided by the patient. No language interpreter was used.    Past Medical History:  Diagnosis Date   Allergic rhinitis    BPH with obstruction/lower urinary tract symptoms    Chronic headaches    Diabetes mellitus without complication (HCC)    Diverticulosis    Dyslipidemia    Erectile dysfunction    Fatty liver    GERD (gastroesophageal reflux disease)    Hypogonadism in male    Personal history of multiple concussions    Steatohepatitis, non-alcoholic     Patient Active Problem List   Diagnosis Date Noted   Infective right otitis media 06/02/2023   COVID-19 06/02/2023   Chronic prostatitis 05/28/2018   Abnormal ECG 05/04/2018   Chronic pain of left knee 10/08/2017   Impingement syndrome of left shoulder 10/08/2017   Numbness and tingling of upper extremity 10/08/2017   Migraine without aura and without status migrainosus, not intractable 09/23/2017   Erectile dysfunction of non-organic origin 04/14/2016   Non-seasonal allergic rhinitis 03/23/2016   Hypogonadism in male 10/17/2015   BPH with obstruction/lower urinary tract symptoms 10/17/2015   Balanitis 10/17/2015   Polypharmacy 04/18/2015   Encounter for long-term (current) use of medications 04/18/2015   Muscle ache 10/13/2014   Avitaminosis D 03/29/2014   Abscess or cellulitis of leg 11/08/2012   Fatty liver disease, nonalcoholic 01/07/2012   Acid reflux 01/07/2012   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 Authorizing Provider  amoxicillin-clavulanate (AUGMENTIN) 875-125 MG tablet Take 1 tablet by mouth every 12 (twelve) hours. 06/02/23  Yes Gregori Abril, Para March, NP  aspirin 81 MG tablet Take 81 mg by mouth daily.    [provider]  cholecalciferol (VITAMIN D) 400 UNITS TABS tablet Take 1,000 Units by mouth daily.    [provider]  Continuous Blood Gluc Sensor (FREESTYLE LIBRE 3 SENSOR) MISC USE AS DIRECTED EVERY 14 DAYS 12/24/22   [provider]  Dulaglutide 0.75 MG/0.5ML SOPN Inject into the skin. 01/07/23   [provider]  fenofibrate 160 MG tablet Take 160 mg by mouth daily.    [provider]  lisinopril (PRINIVIL,ZESTRIL) 5 MG tablet Take 5 mg by mouth daily.    [provider]  metFORMIN (GLUCOPHAGE-XR) 500 MG 24 hr tablet Take by mouth. 01/07/23   [provider]  NEEDLE, DISP, 18 G (BD DISP NEEDLES) 18G X 1-1/2" MISC 1 mg by Does not apply route every 14 (fourteen) days. 04/26/23   Michiel Cowboy A, PA-C  NEEDLE, DISP, 21 G (BD DISP NEEDLES) 21G X 1-1/2" MISC  1 mg by Does not apply route every 14 (fourteen) days. 04/26/23   Michiel Cowboy A, PA-C  propranolol (INDERAL) 10 MG tablet Take by mouth. 09/23/17   [provider]  rosuvastatin (CRESTOR) 10 MG tablet Take 1 tablet by mouth daily. 01/07/23 01/07/24  [provider]  sildenafil (REVATIO) 20 MG tablet Take 3 to 5 tablets two hours before intercouse on an empty stomach.  Do not take with nitrates. 07/15/21   Michiel Cowboy A, PA-C  SUMAtriptan (IMITREX) 50 MG tablet Take by mouth. 11/28/20   [provider]  Syringe, Disposable, (2-3CC SYRINGE)  3 ML MISC 1 mg by Does not apply route every 14 (fourteen) days. 04/26/23   Michiel Cowboy A, PA-C  testosterone cypionate (DEPOTESTOSTERONE CYPIONATE) 200 MG/ML injection ADMINISTER 0.5 ML IN THE MUSCLE EVERY WEEK 04/26/23   McGowan, Carollee Herter A, PA-C  VASCEPA 1 g CAPS TK 2 CS PO BID 09/26/17   [provider]  vitamin B-12 (CYANOCOBALAMIN) 1000 MCG tablet Take by mouth.    [provider]  vitamin E 400 UNIT capsule Take 800 Units by mouth daily.    [provider]    Family History Family History  Problem Relation Age of Onset   Hypertension Mother    Alcohol abuse Mother    Diabetes Mother    Stroke Mother    Transient ischemic attack Father    Hyperlipidemia Father    Melanoma Father    Heart attack Father    Kidney cancer Neg Hx    Prostate cancer Neg Hx    Bladder Cancer Neg Hx     Social History Social History   Tobacco Use   Smoking status: Never    Passive exposure: Never   Smokeless tobacco: Former  Building services engineer Use: Never used  Substance Use Topics   Alcohol use: Yes    Alcohol/week: 0.0 standard drinks of alcohol    Comment: occasional   Drug use: No     Allergies   Codeine, Fenofibrate, Fish oil, Niacin, Niacin and related, Prednisone, and Tricor [fenofibrate]   Review of Systems Review of Systems  Constitutional:  Negative for fever.  HENT:  Positive for congestion, ear pain, sinus pressure and sinus pain.   Respiratory:  Negative for cough.   All other systems reviewed and are negative.    Physical Exam Triage Vital Signs ED Triage Vitals [06/02/23 0916]  Enc Vitals Group     BP (!) 126/90     Pulse Rate 85     Resp      Temp 98 F (36.7 C)     Temp Source Oral     SpO2 99 %     Weight      Height      Head Circumference      Peak Flow      Pain Score 4     Pain Loc      Pain Edu?      Excl. in GC?    No data found.  Updated Vital Signs BP (!) 126/90 (BP Location: Left Arm)   Pulse 85   Temp 98  F (36.7 C) (Oral)   SpO2 99%   Visual Acuity Right Eye Distance:   Left Eye Distance:   Bilateral Distance:    Right Eye Near:   Left Eye Near:    Bilateral Near:     Physical Exam Vitals and nursing note reviewed.  Constitutional:  General: He is not in acute distress.    Appearance: He is well-developed. He is not ill-appearing or toxic-appearing.  HENT:     Head: Normocephalic.     Right Ear: Tympanic membrane is erythematous and bulging.     Left Ear: Tympanic membrane is retracted.     Nose: Mucosal edema and congestion present.     Mouth/Throat:     Lips: Pink.     Mouth: Mucous membranes are moist.     Pharynx: Oropharynx is clear. Uvula midline.  Eyes:     General: Lids are normal.     Conjunctiva/sclera: Conjunctivae normal.     Pupils: Pupils are equal, round, and reactive to light.  Cardiovascular:     Rate and Rhythm: Normal rate and regular rhythm.     Pulses: Normal pulses.     Heart sounds: Normal heart sounds.  Pulmonary:     Effort: Pulmonary effort is normal. No respiratory distress.     Breath sounds: Normal breath sounds and air entry. No decreased breath sounds or wheezing.  Abdominal:     General: There is no distension.     Palpations: Abdomen is soft.  Musculoskeletal:        General: Normal range of motion.     Cervical back: Normal range of motion.  Skin:    General: Skin is warm and dry.     Findings: No rash.  Neurological:     General: No focal deficit present.     Mental Status: He is alert and oriented to person, place, and time.     GCS: GCS eye subscore is 4. GCS verbal subscore is 5. GCS motor subscore is 6.     Cranial Nerves: No cranial nerve deficit.     Sensory: No sensory deficit.  Psychiatric:        Attention and Perception: Attention normal.        Mood and Affect: Mood normal.        Speech: Speech normal.        Behavior: Behavior normal. Behavior is cooperative.      UC Treatments / Results  Labs (all  labs ordered are listed, but only abnormal results are displayed) Labs Reviewed  SARS CORONAVIRUS 2 BY RT PCR - Abnormal; Notable for the following components:      Result Value   SARS Coronavirus 2 by RT PCR POSITIVE (*)    All other components within normal limits    EKG   Radiology No results found.  Procedures Procedures (including critical care time)  Medications Ordered in UC Medications - No data to display  Initial Impression / Assessment and Plan / UC Course  I have reviewed the triage vital signs and the nursing notes.  Pertinent labs & imaging results that were available during my care of the patient were reviewed by me and considered in my medical decision making (see chart for details).  Clinical Course as of 06/02/23 1039  Tue Jun 02, 2023  0935 Covid test pending [JD]    Clinical Course User Index [JD] Briony Parveen, Para March, NP   I have verbally reviewed the discharge instructions with the patient. A printed AVS was given to the patient.  All questions were answered prior to discharge.  Patient verbalized understanding to this provider.  Ddx: RAOM, Viral illness,allergies Final Clinical Impressions(s) / UC Diagnoses   Final diagnoses:  Infective right otitis media  COVID-19     Discharge Instructions      You  have tested positive for COVID. 5 days of wearing mask, no quarantine required. Take antibiotic as directed for ear infection, drink plenty of fluids. Return as needed.      ED Prescriptions     Medication Sig Dispense Auth. Provider   amoxicillin-clavulanate (AUGMENTIN) 875-125 MG tablet Take 1 tablet by mouth every 12 (twelve) hours. 14 tablet Shonna Deiter, Para March, NP      PDMP not reviewed this encounter.   Clancy Gourd, NP 06/02/23 1039

## 2023-07-21 ENCOUNTER — Other Ambulatory Visit
Admission: RE | Admit: 2023-07-21 | Discharge: 2023-07-21 | Disposition: A | Discharge status: Home / Self Care | Payer: BC Managed Care – PPO | Attending: Urology | Admitting: Urology

## 2023-07-21 DIAGNOSIS — E291 Testicular hypofunction: Secondary | ICD-10-CM | POA: Diagnosis present

## 2023-07-21 DIAGNOSIS — N401 Enlarged prostate with lower urinary tract symptoms: Secondary | ICD-10-CM | POA: Insufficient documentation

## 2023-07-21 DIAGNOSIS — N138 Other obstructive and reflux uropathy: Secondary | ICD-10-CM | POA: Diagnosis present

## 2023-07-21 LAB — HEMOGLOBIN AND HEMATOCRIT, BLOOD
HCT: 45.4 % (ref 39.0–52.0)
Hemoglobin: 14.8 g/dL (ref 13.0–17.0)

## 2023-07-21 LAB — PSA: Prostatic Specific Antigen: 0.35 ng/mL (ref 0.00–4.00)

## 2023-07-29 NOTE — Progress Notes (Deleted)
07/30/2023 8:24 AM   [name]. Mar 23, 1966 [id number]  Referring provider: Orene Desanctis, MD [address] [place],  [place] [address]  Urological history: 1. High risk hematuria - Non-smoker.  - CTU (07/2019) - punctate 1-2 mm stone inferior pole right kidney.   -Cysto (07/2019) -  NED   2. Testosterone deficiency -contributing factors of age and diabetes -testosterone (06/2023) 947 -hemoglobin/HCT (06/2023) 14.8/45.4 -testosterone cypionate 200 mg/cc, 0.5 cc q weekly   3. ED -contributing factors of age, BPH, testosterone deficiency, DM, HTN and HLD -sildenafil 20 mg on demand dosing   4. BPH with LU TS -PSA (06/2023) 0.35  No chief complaint on file.  HPI: Jeffrey Cook. is a 57 y.o. male who presents today for follow up.   Previous records reviewed.    I PSS ***    Score:  1-7 Mild 8-19 Moderate 20-35 Severe    SHIM ***    Score: 1-7 Severe ED 8-11 Moderate ED 12-16 Mild-Moderate ED 17-21 Mild ED 22-25 No ED    PMH: Past Medical History:  Diagnosis Date   Allergic rhinitis    BPH with obstruction/lower urinary tract symptoms    Chronic headaches    Diabetes mellitus without complication (HCC)    Diverticulosis    Dyslipidemia    Erectile dysfunction    Fatty liver    GERD (gastroesophageal reflux disease)    Hypogonadism in male    Personal history of multiple concussions    Steatohepatitis, non-alcoholic     Surgical History: Past Surgical History:  Procedure Laterality Date   LIVER BIOPSY  06/10/2009   MYELOGRAM     NASAL SINUS SURGERY      Home Medications:  Allergies as of 07/30/2023       Reactions   Codeine    Fenofibrate Other (See Comments)   Fish Oil    Other reaction(s): Other (See Comments) Dyspepsia, foul taste, loose stools - tolerates Lovaza   Niacin    Other reaction(s): Liver Disorder He has a history of liver test elevations while taking niacin prior to 2006. No niacin trial since then. 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   Niacin And Related    Prednisone    Headache   Tricor [fenofibrate]         Medication List        Accurate as of July 29, 2023  8:24 AM. If you have any questions, ask your nurse or doctor.          2-3CC SYRINGE 3 ML Misc 1 mg by Does not apply route every 14 (fourteen) days.   amoxicillin-clavulanate 875-125 MG tablet Commonly known as: AUGMENTIN Take 1 tablet by mouth every 12 (twelve) hours.   aspirin 81 MG tablet Take 81 mg by mouth daily.   BD Disp Needles 18G X 1-1/2" Misc Generic drug: NEEDLE (DISP) 18 G 1 mg by Does not apply route every 14 (fourteen) days.   BD Disp Needles 21G X 1-1/2" Misc Generic drug: NEEDLE (DISP) 21 G 1 mg by Does not apply route every 14 (fourteen) days.   cholecalciferol 10 MCG (400 UNIT) Tabs tablet Commonly known as: VITAMIN D3 Take 1,000 Units by mouth daily.   cyanocobalamin 1000 MCG tablet Commonly known as: VITAMIN B12 Take by mouth.   Dulaglutide 0.75 MG/0.5ML Sopn Inject into the skin.   fenofibrate 160 MG tablet Take 160 mg by mouth daily.   FreeStyle [employer] 3 Sensor  Misc USE AS DIRECTED EVERY 14 DAYS   lisinopril 5 MG tablet Commonly known as: ZESTRIL Take 5 mg by mouth daily.   metFORMIN 500 MG 24 hr tablet Commonly known as: GLUCOPHAGE-XR Take by mouth.   propranolol 10 MG tablet Commonly known as: INDERAL Take by mouth.   rosuvastatin 10 MG tablet Commonly known as: CRESTOR Take 1 tablet by mouth daily.   sildenafil 20 MG tablet Commonly known as: REVATIO Take 3 to 5 tablets two hours before intercouse on an empty stomach.  Do not take with nitrates.   SUMAtriptan 50 MG tablet Commonly known as: IMITREX Take by mouth.   testosterone cypionate 200 MG/ML injection Commonly known as: DEPOTESTOSTERONE CYPIONATE ADMINISTER 0.5 ML IN THE MUSCLE EVERY WEEK   Vascepa 1 g capsule Generic drug: icosapent Ethyl TK 2 CS PO BID   vitamin E 180 MG  (400 UNITS) capsule Take 800 Units by mouth daily.        Allergies:  Allergies  Allergen Reactions   Codeine    Fenofibrate Other (See Comments)   Fish Oil     Other reaction(s): Other (See Comments) Dyspepsia, foul taste, loose stools - tolerates Lovaza   Niacin     Other reaction(s): Liver Disorder He has a history of liver test elevations while taking niacin prior to 2006. No niacin trial since then. 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   Niacin And Related    Prednisone     Headache   Tricor [Fenofibrate]     Family History: Family History  Problem Relation Age of Onset   Hypertension Mother    Alcohol abuse Mother    Diabetes Mother    Stroke Mother    Transient ischemic attack Father    Hyperlipidemia Father    Melanoma Father    Heart attack Father    Kidney cancer Neg Hx    Prostate cancer Neg Hx    Bladder Cancer Neg Hx     Social History:  reports that he has never smoked. He has never been exposed to tobacco smoke. He has quit using smokeless tobacco. He reports current alcohol use. He reports that he does not use drugs.  ROS: Pertinent ROS in HPI  Physical Exam: There were no vitals taken for this visit.  Constitutional:  Well nourished. Alert and oriented, No acute distress. HEENT: [place] AT, moist mucus membranes.  Trachea midline, no masses. Cardiovascular: No clubbing, cyanosis, or edema. Respiratory: Normal respiratory effort, no increased work of breathing. GI: Abdomen is soft, non tender, non distended, no abdominal masses. Liver and spleen not palpable.  No hernias appreciated.  Stool sample for occult testing is not indicated.   GU: No CVA tenderness.  No bladder fullness or masses.  Patient with circumcised/uncircumcised phallus. ***Foreskin easily retracted***  Urethral meatus is patent.  No penile discharge. No penile lesions or rashes. Scrotum without lesions, cysts, rashes and/or edema.  Testicles are located scrotally  bilaterally. No masses are appreciated in the testicles. Left and right epididymis are normal. Rectal: Patient with  normal sphincter tone. Anus and perineum without scarring or rashes. No rectal masses are appreciated. Prostate is approximately *** grams, *** nodules are appreciated. Seminal vesicles are normal. Skin: No rashes, bruises or suspicious lesions. Lymph: No cervical or inguinal adenopathy. Neurologic: Grossly intact, no focal deficits, moving all 4 extremities. Psychiatric: Normal mood and affect.  Laboratory Data: Results for orders placed or performed during the hospital encounter of 07/21/23  PSA  Result Value Ref Range   Prostatic Specific Antigen 0.35 0.00 - 4.00 ng/mL  Hemoglobin and hematocrit, blood  Result Value Ref Range   Hemoglobin 14.8 13.0 - 17.0 g/dL   HCT 65.7 84.6 - 96.2 %  Testosterone  Result Value Ref Range   Testosterone 947 (H) 264 - 916 ng/dL   Hemoglobin X5M Order: [id number] Component Ref Range & Units 3 wk ago  Hemoglobin A1C <5.7 % 6.4 High   Average Blood Glucose (Calculated From HgBA1c Level) mg/dL 027  Resulting Agency [facility]  Narrative Performed by [facility] Between 5.7% and 6.4% is suggestive of Pre-Diabetes or controlled Diabetes. Greater than or equal to 6.5% is suggestive of Diabetes, and if more than one value, diagnostic.  Accuracy may be reduced by anemia, hemoglobinopathy, recent transfusion, sickle cell, artificial heart valve, dialysis, TIPS, severe hyperglycemia, etc.  Specimen Collected: 07/07/23 08:06   Performed by: [facility] Last Resulted: 07/07/23 19:03  Received From: [facility]  Result Received: 07/21/23 08:08   Lipid Panel W/Calculated Low Density Lipoprotein (LDL) Cholesterol Order: [id number] Component Ref Range & Units 3 wk ago  Cholesterol, Total mg/dL 94  Comment: The significance of total cholesterol depends on the  values of individual components including HDL, LDL, non-HDL, and triglycerides.  LDL Calculated <190 mg/dL 44  Comment: <47 mg/dL      Desired target for prior heart disease, stroke, and those at high-risk. Even lower levels may be recommended to decrease risk of heart attack and stroke. 70-159 mg/dL   Comprehensive cardiovascular risk assessment is recommended. Statin therapy may be advised based on risk factors. 160-189 mg/dL  Moderately elevated LDL level. Statin therapy recommended if other risk factors present. >=190 mg/dL    Severely elevated LDL level. High long-term risk of heart disease and stroke. High-intensity statin therapy recommended for most people. Consider specialist referral.  *A healthy diet and exercise are recommended for all to reduce heart disease risk. Statin choice should be based on patient preference after patient-provider discussions.   Ref: 2018 ACC/AHA Guideline  HDL mg/dL 27  Comment: People with low HDL levels (see below) are at increased risk of heart disease: <50 mg/dL for Women <42 mg/dL for Men  Triglyceride <595 mg/dL 638  Comment: <756 mg/dL      Normal 433-295 mg/dL   High Triglycerides. Risk of heart disease may be increased. Address reversible causes (eg sugar in foods and beverages, alcohol, and diabetes control). Medication may be appropriate based on other clinical factors. >=500 mg/dL     Very High Triglycerides. Risk of heart disease and pancreatitis increased. Address reversible causes as above. Medication to lower triglycerides usually advised.   *Ranges provided for adults, pediatric guidelines vary.  Resulting Agency [facility]   Specimen Collected: 07/07/23 08:06   Performed by: [facility] Last Resulted: 07/07/23 18:46  Received From: [facility]  Result Received: 07/21/23 08:08   Comprehensive Metabolic Panel (CMP) Order: [id number] Component Ref Range & Units 3 wk ago   Sodium 135 - 145 mmol/L 139  Potassium 3.5 - 5.0 mmol/L 3.8  Chloride 98 - 108 mmol/L 103  Carbon Dioxide (CO2) 21 - 30 mmol/L 25  Urea Nitrogen (BUN) 7 - 20 mg/dL 14  Creatinine 0.6 - 1.3 mg/dL 1.3  Glucose 70 - 301 mg/dL 601 High   Comment: Interpretive Data: Above is the NONFASTING reference range.  Below are the FASTING reference  ranges: NORMAL:      70-99 mg/dL PREDIABETES: 784-696 mg/dL DIABETES:    > 295 mg/dL  Calcium 8.7 - 28.4 mg/dL 8.6 Low   AST (Aspartate Aminotransferase) 15 - 41 U/L 20  ALT (Alanine Aminotransferase) 15 - 50 U/L 15  Bilirubin, Total 0.4 - 1.5 mg/dL 0.4  Alk Phos (Alkaline Phosphatase) 24 - 110 U/L 34  Albumin 3.5 - 4.8 g/dL 4.0  Protein, Total 6.2 - 8.1 g/dL 6.7  Anion Gap 3 - 12 mmol/L 11  BUN/CREA Ratio 6 - 27 11  Glomerular Filtration Rate (eGFR) mL/min/1.73sq m 64  Comment: CKD-EPI (2021) does not include patient's race in the calculation of eGFR. Monitoring changes of plasma creatinine and eGFR over time is useful for monitoring kidney function.  This change was made on 02/19/2021.  Interpretive Ranges for eGFR(CKD-EPI 2021):  eGFR:              > 60 mL/min/1.73 sq m - Normal eGFR:              30 - 59 mL/min/1.73 sq m - Moderately Decreased eGFR:              15 - 29 mL/min/1.73 sq m - Severely Decreased eGFR:              < 15 mL/min/1.73 sq m -  Kidney Failure   Note: These eGFR calculations do not apply in acute situations when eGFR is changing rapidly or in patients on dialysis.  Resulting Agency [facility]   Specimen Collected: 07/07/23 08:06   Performed by: [facility] Last Resulted: 07/07/23 17:53  Received From: [facility]  Result Received: 07/21/23 08:08  I have reviewed the labs.   Pertinent Imaging: N/A  Assessment & Plan:  ***  1. Testosterone deficiency  -testosterone levels are therapeutic  -H & H WNL -continue testosterone cypionate 200  mg/milliliters, 0.5 cc every 7 days  2. BPH with LUTS -PSA stable -PVR < 300 cc -symptoms - *** -most bothersome symptoms are *** -continue conservative management, avoiding bladder irritants and timed voiding's -Initiate alpha-blocker (***), discussed side effects *** -Initiate 5 alpha reductase inhibitor (***), discussed side effects *** -Continue tamsulosin 0.4 mg daily, alfuzosin 10 mg daily, Rapaflo 8 mg daily, terazosin, doxazosin, Cialis 5 mg daily and finasteride 5 mg daily, dutasteride 0.5 mg daily***:refills given -Cannot tolerate medication or medication failure, schedule cystoscopy ***  3. Erectile dysfunction:    - I explained that conditions like diabetes, hypertension, coronary artery disease, peripheral vascular disease, smoking, alcohol consumption, age, sleep apnea and BPH can diminish the ability to have an erection - I explained the ED may be a risk marker for underlying CVD and he should follow up with PCP for further studies *** - A recent study published in Sex Med 2018 Apr 13 revealed moderate to vigorous aerobic exercise for 40 minutes 4 times per week can decrease erectile problems caused by physical inactivity, obesity, hypertension, metabolic syndrome and/or cardiovascular diseases *** - We discussed trying a *** different PDE5 inhibitor, intra-urethral suppositories, intracavernous vasoactive drug injection therapy, vacuum erection devices and penile prosthesis implantation      No follow-ups on file.  These notes generated with voice recognition software. I apologize for typographical errors.  Cloretta Ned  [facility] [address]  [address] [place], [place] [address] [phone]

## 2023-07-30 ENCOUNTER — Encounter: Payer: Self-pay | Admitting: Urology

## 2023-07-30 ENCOUNTER — Ambulatory Visit: Payer: BC Managed Care – PPO | Admitting: Urology

## 2023-07-30 DIAGNOSIS — E291 Testicular hypofunction: Secondary | ICD-10-CM

## 2023-07-30 DIAGNOSIS — N529 Male erectile dysfunction, unspecified: Secondary | ICD-10-CM

## 2023-07-30 DIAGNOSIS — N401 Enlarged prostate with lower urinary tract symptoms: Secondary | ICD-10-CM

## 2023-08-13 NOTE — Progress Notes (Signed)
08/14/2023 8:51 AM   Jeffrey Cook. 1966-07-14 [id number]  Referring provider: Orene Desanctis, MD [address] [place],  [place] [address]  Urological history: 1. High risk hematuria - Non-smoker.  - CTU (07/2019) - punctate 1-2 mm stone inferior pole right kidney.   - Cysto (07/2019) -  NED   2. Testosterone deficiency -contributing factors of age and diabetes -testosterone (06/2023) 947 -hemoglobin/HCT (06/2023) 14.8/45.4 -testosterone cypionate 200 mg/cc, 0.5 cc q weekly   3. ED -contributing factors of age, BPH, testosterone deficiency, DM, HTN and HLD -sildenafil 20 mg, on demand dosing   4. BPH with LU TS -PSA (06/2023) 0.35  Chief Complaint  Patient presents with   Follow-up   HPI: Jeffrey Cook. is a 57 y.o. male who presents today for follow up.   Previous records reviewed.    No complaints.  Patient denies any modifying or aggravating factors.  Patient denies any recent UTI's, gross hematuria, dysuria or suprapubic/flank pain.  Patient denies any fevers, chills, nausea or vomiting.   I PSS 6/2   IPSS     Row Name 08/14/23 0800         International Prostate Symptom Score   How often have you had the sensation of not emptying your bladder? Not at All     How often have you had to urinate less than every two hours? Less than half the time     How often have you found you stopped and started again several times when you urinated? Not at All     How often have you found it difficult to postpone urination? Less than 1 in 5 times     How often have you had a weak urinary stream? Less than 1 in 5 times     How often have you had to strain to start urination? Not at All     How many times did you typically get up at night to urinate? 2 Times     Total IPSS Score 6       Quality of Life due to urinary symptoms   If you were to spend the rest of your life with your urinary condition just the way it is now how would you feel about that? Mostly  Satisfied              Score:  1-7 Mild 8-19 Moderate 20-35 Severe    SHIM 24  Patient still having spontaneous erections.     He denies any pain or curvature with erections.     SHIM     Row Name 08/14/23 0836         SHIM: Over the last 6 months:   How do you rate your confidence that you could get and keep an erection? High     When you had erections with sexual stimulation, how often were your erections hard enough for penetration (entering your partner)? Almost Always or Always     During sexual intercourse, how often were you able to maintain your erection after you had penetrated (entered) your partner? Almost Always or Always     During sexual intercourse, how difficult was it to maintain your erection to completion of intercourse? Not Difficult     When you attempted sexual intercourse, how often was it satisfactory for you? Almost Always or Always       SHIM Total Score   SHIM 24  Score: 1-7 Severe ED 8-11 Moderate ED 12-16 Mild-Moderate ED 17-21 Mild ED 22-25 No ED    PMH: Past Medical History:  Diagnosis Date   Allergic rhinitis    BPH with obstruction/lower urinary tract symptoms    Chronic headaches    Diabetes mellitus without complication (HCC)    Diverticulosis    Dyslipidemia    Erectile dysfunction    Fatty liver    GERD (gastroesophageal reflux disease)    Hypogonadism in male    Personal history of multiple concussions    Steatohepatitis, non-alcoholic     Surgical History: Past Surgical History:  Procedure Laterality Date   LIVER BIOPSY  06/10/2009   MYELOGRAM     NASAL SINUS SURGERY      Home Medications:  Allergies as of 08/14/2023       Reactions   Codeine    Fenofibrate Other (See Comments)   Fish Oil    Other reaction(s): Other (See Comments) Dyspepsia, foul taste, loose stools - tolerates Lovaza   Niacin    Other reaction(s): Liver Disorder He has a history of liver test elevations while taking  niacin prior to 2006. No niacin trial since then. 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   Niacin And Related    Prednisone    Headache   Tricor [fenofibrate]         Medication List        Accurate as of August 14, 2023  8:51 AM. If you have any questions, ask your nurse or doctor.          STOP taking these medications    amoxicillin-clavulanate 875-125 MG tablet Commonly known as: AUGMENTIN Stopped by: Nero Sawatzky       TAKE these medications    2-3CC SYRINGE 3 ML Misc 1 mg by Does not apply route every 14 (fourteen) days.   aspirin 81 MG tablet Take 81 mg by mouth daily.   BD Disp Needles 18G X 1-1/2" Misc Generic drug: NEEDLE (DISP) 18 G 1 mg by Does not apply route every 14 (fourteen) days.   BD Disp Needles 21G X 1-1/2" Misc Generic drug: NEEDLE (DISP) 21 G 1 mg by Does not apply route every 14 (fourteen) days.   cholecalciferol 10 MCG (400 UNIT) Tabs tablet Commonly known as: VITAMIN D3 Take 1,000 Units by mouth daily.   cyanocobalamin 1000 MCG tablet Commonly known as: VITAMIN B12 Take by mouth.   Dulaglutide 0.75 MG/0.5ML Sopn Inject into the skin.   fenofibrate 160 MG tablet Take 160 mg by mouth daily.   FreeStyle Libre 3 Sensor Misc USE AS DIRECTED EVERY 14 DAYS   lisinopril 5 MG tablet Commonly known as: ZESTRIL Take 5 mg by mouth daily.   metFORMIN 500 MG 24 hr tablet Commonly known as: GLUCOPHAGE-XR Take by mouth.   propranolol 10 MG tablet Commonly known as: INDERAL Take by mouth.   rosuvastatin 10 MG tablet Commonly known as: CRESTOR Take 1 tablet by mouth daily.   sildenafil 20 MG tablet Commonly known as: REVATIO Take 3 to 5 tablets two hours before intercouse on an empty stomach.  Do not take with nitrates.   SUMAtriptan 50 MG tablet Commonly known as: IMITREX Take by mouth.   testosterone cypionate 200 MG/ML injection Commonly known as: DEPOTESTOSTERONE CYPIONATE ADMINISTER 0.5 ML IN THE  MUSCLE EVERY WEEK   Vascepa 1 g capsule Generic drug: icosapent Ethyl TK 2 CS PO BID   vitamin E 180 MG (  400 UNITS) capsule Take 800 Units by mouth daily.        Allergies:  Allergies  Allergen Reactions   Codeine    Fenofibrate Other (See Comments)   Fish Oil     Other reaction(s): Other (See Comments) Dyspepsia, foul taste, loose stools - tolerates Lovaza   Niacin     Other reaction(s): Liver Disorder He has a history of liver test elevations while taking niacin prior to 2006. No niacin trial since then. 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   Niacin And Related    Prednisone     Headache   Tricor [Fenofibrate]     Family History: Family History  Problem Relation Age of Onset   Hypertension Mother    Alcohol abuse Mother    Diabetes Mother    Stroke Mother    Transient ischemic attack Father    Hyperlipidemia Father    Melanoma Father    Heart attack Father    Kidney cancer Neg Hx    Prostate cancer Neg Hx    Bladder Cancer Neg Hx     Social History:  reports that he has never smoked. He has never been exposed to tobacco smoke. He has quit using smokeless tobacco. He reports current alcohol use. He reports that he does not use drugs.  ROS: Pertinent ROS in HPI  Physical Exam: BP 117/77   Pulse 74   Wt 176 lb (79.8 kg)   BMI 24.55 kg/m   Constitutional:  Well nourished. Alert and oriented, No acute distress. HEENT: [place] AT, moist mucus membranes.  Trachea midline Cardiovascular: No clubbing, cyanosis, or edema. Respiratory: Normal respiratory effort, no increased work of breathing. Neurologic: Grossly intact, no focal deficits, moving all 4 extremities. Psychiatric: Normal mood and affect.  Laboratory Data: Results for orders placed or performed during the hospital encounter of 07/21/23  PSA  Result Value Ref Range   Prostatic Specific Antigen 0.35 0.00 - 4.00 ng/mL  Hemoglobin and hematocrit, blood  Result Value Ref Range    Hemoglobin 14.8 13.0 - 17.0 g/dL   HCT 16.1 09.6 - 04.5 %  Testosterone  Result Value Ref Range   Testosterone 947 (H) 264 - 916 ng/dL   Hemoglobin W0J Order: [id number] Component Ref Range & Units 3 wk ago  Hemoglobin A1C <5.7 % 6.4 High   Average Blood Glucose (Calculated From HgBA1c Level) mg/dL 956  Resulting Agency [facility]  Narrative Performed by [facility] Between 5.7% and 6.4% is suggestive of Pre-Diabetes or controlled Diabetes. Greater than or equal to 6.5% is suggestive of Diabetes, and if more than one value, diagnostic.  Accuracy may be reduced by anemia, hemoglobinopathy, recent transfusion, sickle cell, artificial heart valve, dialysis, TIPS, severe hyperglycemia, etc.  Specimen Collected: 07/07/23 08:06   Performed by: [facility] Last Resulted: 07/07/23 19:03  Received From: [facility]  Result Received: 07/21/23 08:08   Lipid Panel W/Calculated Low Density Lipoprotein (LDL) Cholesterol Order: [id number] Component Ref Range & Units 3 wk ago  Cholesterol, Total mg/dL 94  Comment: The significance of total cholesterol depends on the values of individual components including HDL, LDL, non-HDL, and triglycerides.  LDL Calculated <190 mg/dL 44  Comment: <46 mg/dL      Desired target for prior heart disease, stroke, and those at high-risk. Even lower levels may be recommended to decrease risk of heart attack and stroke. 70-159 mg/dL   Comprehensive cardiovascular risk assessment is  recommended. Statin therapy may be advised based on risk factors. 160-189 mg/dL  Moderately elevated LDL level. Statin therapy recommended if other risk factors present. >=190 mg/dL    Severely elevated LDL level. High long-term risk of heart disease and stroke. High-intensity statin therapy recommended for most people. Consider specialist referral.  *A healthy diet and exercise are recommended for all to  reduce heart disease risk. Statin choice should be based on patient preference after patient-provider discussions.   Ref: 2018 ACC/AHA Guideline  HDL mg/dL 27  Comment: People with low HDL levels (see below) are at increased risk of heart disease: <50 mg/dL for Women <51 mg/dL for Men  Triglyceride <761 mg/dL 607  Comment: <371 mg/dL      Normal 062-694 mg/dL   High Triglycerides. Risk of heart disease may be increased. Address reversible causes (eg sugar in foods and beverages, alcohol, and diabetes control). Medication may be appropriate based on other clinical factors. >=500 mg/dL     Very High Triglycerides. Risk of heart disease and pancreatitis increased. Address reversible causes as above. Medication to lower triglycerides usually advised.   *Ranges provided for adults, pediatric guidelines vary.  Resulting Agency [facility]   Specimen Collected: 07/07/23 08:06   Performed by: [facility] Last Resulted: 07/07/23 18:46  Received From: [facility]  Result Received: 07/21/23 08:08   Comprehensive Metabolic Panel (CMP) Order: [id number] Component Ref Range & Units 3 wk ago  Sodium 135 - 145 mmol/L 139  Potassium 3.5 - 5.0 mmol/L 3.8  Chloride 98 - 108 mmol/L 103  Carbon Dioxide (CO2) 21 - 30 mmol/L 25  Urea Nitrogen (BUN) 7 - 20 mg/dL 14  Creatinine 0.6 - 1.3 mg/dL 1.3  Glucose 70 - 009 mg/dL 381 High   Comment: Interpretive Data: Above is the NONFASTING reference range.  Below are the FASTING reference ranges: NORMAL:      70-99 mg/dL PREDIABETES: 829-937 mg/dL DIABETES:    > 169 mg/dL  Calcium 8.7 - 67.8 mg/dL 8.6 Low   AST (Aspartate Aminotransferase) 15 - 41 U/L 20  ALT (Alanine Aminotransferase) 15 - 50 U/L 15  Bilirubin, Total 0.4 - 1.5 mg/dL 0.4  Alk Phos (Alkaline Phosphatase) 24 - 110 U/L 34  Albumin 3.5 - 4.8 g/dL 4.0  Protein, Total 6.2 - 8.1 g/dL 6.7  Anion Gap 3 - 12 mmol/L 11   BUN/CREA Ratio 6 - 27 11  Glomerular Filtration Rate (eGFR) mL/min/1.73sq m 64  Comment: CKD-EPI (2021) does not include patient's race in the calculation of eGFR. Monitoring changes of plasma creatinine and eGFR over time is useful for monitoring kidney function.  This change was made on 02/19/2021.  Interpretive Ranges for eGFR(CKD-EPI 2021):  eGFR:              > 60 mL/min/1.73 sq m - Normal eGFR:              30 - 59 mL/min/1.73 sq m - Moderately Decreased eGFR:              15 - 29 mL/min/1.73 sq m - Severely Decreased eGFR:              < 15 mL/min/1.73 sq m -  Kidney Failure   Note: These eGFR calculations do not apply in acute situations when eGFR is changing rapidly or in patients on dialysis.  Resulting Agency [facility]   Specimen Collected: 07/07/23 08:06   Performed by: [facility] Last Resulted: 07/07/23 17:53  Received From: [facility]  Result Received: 07/21/23 08:08  I have reviewed the labs.   Pertinent Imaging: N/A  Assessment & Plan:    1. Testosterone deficiency  -testosterone levels are therapeutic  -H & H WNL -continue testosterone cypionate 200 mg/milliliters, 0.5 cc every 7 days  2. BPH with LUTS -PSA stable -PVR < 300 cc -symptoms -urgency -continue conservative management, avoiding bladder irritants and timed voiding's  3. Erectile dysfunction:    - at goal    Return in about 6 months (around 02/14/2024) for PSA, testosterone (one week after injection) H & H, IPSS, SHIM and exam.  These notes generated with voice recognition software. I apologize for typographical errors.  Cloretta Ned  [facility] [address]  [address] [place], [place] [address] [phone]

## 2023-08-14 ENCOUNTER — Ambulatory Visit: Payer: BC Managed Care – PPO | Admitting: Urology

## 2023-08-14 ENCOUNTER — Encounter: Payer: Self-pay | Admitting: Urology

## 2023-08-14 VITALS — BP 117/77 | HR 74 | Wt 176.0 lb

## 2023-08-14 DIAGNOSIS — N529 Male erectile dysfunction, unspecified: Secondary | ICD-10-CM | POA: Diagnosis not present

## 2023-08-14 DIAGNOSIS — E291 Testicular hypofunction: Secondary | ICD-10-CM

## 2023-08-14 DIAGNOSIS — N401 Enlarged prostate with lower urinary tract symptoms: Secondary | ICD-10-CM

## 2023-08-14 MED ORDER — TESTOSTERONE CYPIONATE 200 MG/ML IM SOLN
INTRAMUSCULAR | 0 refills | Status: DC
Start: 2023-08-14 — End: 2024-02-15

## 2023-08-14 NOTE — Addendum Note (Signed)
Addended by: Sueanne Margarita on: 08/14/2023 08:59 AM   Modules accepted: Orders

## 2023-09-08 ENCOUNTER — Telehealth: Payer: Self-pay | Admitting: *Deleted

## 2023-09-08 NOTE — Telephone Encounter (Signed)
Prior auth for testosterone sent to [facility] (Key: [id number])

## 2023-09-08 NOTE — Telephone Encounter (Signed)
Prior auth for testosterone denied, letter will be scanned to chart.

## 2024-02-09 ENCOUNTER — Other Ambulatory Visit
Admission: RE | Admit: 2024-02-09 | Discharge: 2024-02-09 | Disposition: A | Discharge status: Home / Self Care | Payer: 59 | Attending: Urology | Admitting: Urology

## 2024-02-09 DIAGNOSIS — N401 Enlarged prostate with lower urinary tract symptoms: Secondary | ICD-10-CM | POA: Diagnosis present

## 2024-02-09 DIAGNOSIS — E291 Testicular hypofunction: Secondary | ICD-10-CM | POA: Diagnosis present

## 2024-02-09 DIAGNOSIS — N529 Male erectile dysfunction, unspecified: Secondary | ICD-10-CM | POA: Diagnosis present

## 2024-02-09 DIAGNOSIS — N138 Other obstructive and reflux uropathy: Secondary | ICD-10-CM | POA: Diagnosis present

## 2024-02-09 LAB — PSA: Prostatic Specific Antigen: 0.32 ng/mL (ref 0.00–4.00)

## 2024-02-09 LAB — HEMOGLOBIN AND HEMATOCRIT, BLOOD
HCT: 46.7 % (ref 39.0–52.0)
Hemoglobin: 16 g/dL (ref 13.0–17.0)

## 2024-02-10 LAB — TESTOSTERONE: Testosterone: 421 ng/dL (ref 264–916)

## 2024-02-14 ENCOUNTER — Other Ambulatory Visit: Payer: Self-pay | Admitting: Urology

## 2024-02-14 DIAGNOSIS — R319 Hematuria, unspecified: Secondary | ICD-10-CM

## 2024-02-14 NOTE — Progress Notes (Signed)
 02/15/2024 1:25 PM   Jeffrey Cook. Apr 17, 1966 [id number]  Referring provider: Orene Desanctis, MD [address] [place],  [place] [address]  Urological history: 1. High risk hematuria - Non-smoker.  - CTU (07/2019) - punctate 1-2 mm stone inferior pole right kidney.   - Cysto (07/2019) -  NED   2. Testosterone deficiency -contributing factors of age and diabetes -testosterone (01/2024) 421 -hemoglobin/HCT (01/2024) 16.0/46.7 -testosterone cypionate 200 mg/cc, 0.5 cc q weekly   3. ED -contributing factors of age, BPH, testosterone deficiency, DM, HTN and HLD -sildenafil 20 mg, on demand dosing   4. BPH with LU TS -PSA (01/2024) 0.32  Chief Complaint  Patient presents with   Benign Prostatic Hypertrophy   Hypogonadism   HPI: Jeffrey Cook. is a 58 y.o. male who presents today for follow up.   Previous records reviewed.    I PSS 5/1   He has some urinary urgency at times, but it is not bothersome.  Patient denies any modifying or aggravating factors.  Patient denies any recent UTI's, gross hematuria, dysuria or suprapubic/flank pain.  Patient denies any fevers, chills, nausea or vomiting.     IPSS     Row Name 02/15/24 1300         International Prostate Symptom Score   How often have you had the sensation of not emptying your bladder? Not at All     How often have you had to urinate less than every two hours? Less than half the time     How often have you found you stopped and started again several times when you urinated? Not at All     How often have you found it difficult to postpone urination? Less than half the time     How often have you had a weak urinary stream? Not at All     How often have you had to strain to start urination? Not at All     How many times did you typically get up at night to urinate? 1 Time     Total IPSS Score 5       Quality of Life due to urinary symptoms   If you were to spend the rest of your life with your urinary  condition just the way it is now how would you feel about that? Pleased              Score:  1-7 Mild 8-19 Moderate 20-35 Severe    SHIM 24  Patient still having spontaneous erections.  He denies any pain or curvature with erections.  He does not need to use the sildenafil as often.   SHIM     Row Name 02/15/24 1304         SHIM: Over the last 6 months:   How do you rate your confidence that you could get and keep an erection? High     When you had erections with sexual stimulation, how often were your erections hard enough for penetration (entering your partner)? Almost Always or Always     During sexual intercourse, how often were you able to maintain your erection after you had penetrated (entered) your partner? Almost Always or Always     During sexual intercourse, how difficult was it to maintain your erection to completion of intercourse? Not Difficult     When you attempted sexual intercourse, how often was it satisfactory for you? Almost Always or Always       SHIM  Total Score   SHIM 24             Score: 1-7 Severe ED 8-11 Moderate ED 12-16 Mild-Moderate ED 17-21 Mild ED 22-25 No ED    PMH: Past Medical History:  Diagnosis Date   Allergic rhinitis    BPH with obstruction/lower urinary tract symptoms    Chronic headaches    Diabetes mellitus without complication (HCC)    Diverticulosis    Dyslipidemia    Erectile dysfunction    Fatty liver    GERD (gastroesophageal reflux disease)    Hypogonadism in male    Personal history of multiple concussions    Steatohepatitis, non-alcoholic     Surgical History: Past Surgical History:  Procedure Laterality Date   LIVER BIOPSY  06/10/2009   MYELOGRAM     NASAL SINUS SURGERY      Home Medications:  Allergies as of 02/15/2024       Reactions   Codeine    Fenofibrate Other (See Comments)   Fish Oil    Other reaction(s): Other (See Comments) Dyspepsia, foul taste, loose stools - tolerates Lovaza    Niacin    Other reaction(s): Liver Disorder He has a history of liver test elevations while taking niacin prior to 2006. No niacin trial since then. 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   Niacin And Related    Prednisone    Headache   Tricor [fenofibrate]         Medication List        Accurate as of February 15, 2024  1:25 PM. If you have any questions, ask your nurse or doctor.          2-3CC SYRINGE 3 ML Misc 1 mg by Does not apply route every 14 (fourteen) days.   aspirin 81 MG tablet Take 81 mg by mouth daily.   BD Disp Needles 18G X 1-1/2" Misc Generic drug: NEEDLE (DISP) 18 G 1 mg by Does not apply route every 14 (fourteen) days.   BD Disp Needles 21G X 1-1/2" Misc Generic drug: NEEDLE (DISP) 21 G 1 mg by Does not apply route every 14 (fourteen) days.   cholecalciferol 10 MCG (400 UNIT) Tabs tablet Commonly known as: VITAMIN D3 Take 1,000 Units by mouth daily.   cyanocobalamin 1000 MCG tablet Commonly known as: VITAMIN B12 Take by mouth.   Dulaglutide 0.75 MG/0.5ML Soaj Inject into the skin.   fenofibrate 160 MG tablet Take 160 mg by mouth daily.   FreeStyle Libre 3 Sensor Misc USE AS DIRECTED EVERY 14 DAYS   lisinopril 5 MG tablet Commonly known as: ZESTRIL Take 5 mg by mouth daily.   metFORMIN 500 MG 24 hr tablet Commonly known as: GLUCOPHAGE-XR Take by mouth.   propranolol 10 MG tablet Commonly known as: INDERAL Take by mouth.   rosuvastatin 10 MG tablet Commonly known as: CRESTOR Take 1 tablet by mouth daily.   sildenafil 20 MG tablet Commonly known as: REVATIO Take 3 to 5 tablets two hours before intercouse on an empty stomach.  Do not take with nitrates.   SUMAtriptan 50 MG tablet Commonly known as: IMITREX Take by mouth.   testosterone cypionate 200 MG/ML injection Commonly known as: DEPOTESTOSTERONE CYPIONATE ADMINISTER 0.5 ML IN THE MUSCLE EVERY WEEK   Vascepa 1 g capsule Generic drug: icosapent  Ethyl TK 2 CS PO BID   vitamin E 180 MG (400 UNITS) capsule Take 800 Units by mouth daily.  Allergies:  Allergies  Allergen Reactions   Codeine    Fenofibrate Other (See Comments)   Fish Oil     Other reaction(s): Other (See Comments) Dyspepsia, foul taste, loose stools - tolerates Lovaza   Niacin     Other reaction(s): Liver Disorder He has a history of liver test elevations while taking niacin prior to 2006. No niacin trial since then. 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   Niacin And Related    Prednisone     Headache   Tricor [Fenofibrate]     Family History: Family History  Problem Relation Age of Onset   Hypertension Mother    Alcohol abuse Mother    Diabetes Mother    Stroke Mother    Transient ischemic attack Father    Hyperlipidemia Father    Melanoma Father    Heart attack Father    Kidney cancer Neg Hx    Prostate cancer Neg Hx    Bladder Cancer Neg Hx     Social History:  reports that he has never smoked. He has never been exposed to tobacco smoke. He has quit using smokeless tobacco. He reports current alcohol use. He reports that he does not use drugs.  ROS: Pertinent ROS in HPI  Physical Exam: BP 133/83   Pulse 78   Ht 5\' 11"  (1.803 m)   Wt 180 lb (81.6 kg)   BMI 25.10 kg/m   Constitutional:  Well nourished. Alert and oriented, No acute distress. HEENT: [place] AT, moist mucus membranes.  Trachea midline, no masses. Cardiovascular: No clubbing, cyanosis, or edema. Respiratory: Normal respiratory effort, no increased work of breathing. GU: No CVA tenderness.  No bladder fullness or masses.  Patient with circumcised phallus.   Urethral meatus is patent.  No penile discharge. No penile lesions or rashes. Scrotum without lesions, cysts, rashes and/or edema.  Testicles are located scrotally bilaterally. No masses are appreciated in the testicles. Left and right epididymis are normal. Rectal: Patient with  normal sphincter tone.  Anus and perineum without scarring or rashes. No rectal masses are appreciated. Prostate is approximately 45 grams, no nodules are appreciated. Seminal vesicles could not be palpated.  Neurologic: Grossly intact, no focal deficits, moving all 4 extremities. Psychiatric: Normal mood and affect.   Laboratory Data: Results for orders placed or performed during the hospital encounter of 02/09/24  PSA   Collection Time: 02/09/24  7:54 AM  Result Value Ref Range   Prostatic Specific Antigen 0.32 0.00 - 4.00 ng/mL  Hemoglobin and hematocrit, blood   Collection Time: 02/09/24  7:54 AM  Result Value Ref Range   Hemoglobin 16.0 13.0 - 17.0 g/dL   HCT 95.2 84.1 - 32.4 %  Testosterone   Collection Time: 02/09/24  7:54 AM  Result Value Ref Range   Testosterone 421 264 - 916 ng/dL   Complete Blood Count (CBC) with Differential Order: [id number] Component Ref Range & Units 1 mo ago  WBC (White Blood Cell Count) 3.2 - 9.8 x10^9/L 5.6  Hemoglobin 13.7 - 17.3 g/dL 66.4  Hematocrit 40.3 - 49.0 % 49.8 High   Platelets 150 - 450 x10^9/L 213  MCV (Mean Corpuscular Volume) 80 - 98 fL 84  MCH (Mean Corpuscular Hemoglobin) 26.5 - 34.0 pg 27.6  MCHC (Mean Corpuscular Hemoglobin Concentration) 31.5 - 36.3 % 32.7  RBC (Red Blood Cell Count) 4.37 - 5.74 x10^12/L 5.9 High   RDW-CV (Red Cell Distribution Width) 11.5 - 14.5 % 14.6 High  NRBC (Nucleated Red Blood Cell Count) 0 x10^9/L 0  NRBC % (Nucleated Red Blood Cell %) % 0  MPV (Mean Platelet Volume) 7.2 - 11.7 fL 11.6  Neutrophil Count 2.0 - 8.6 x10^9/L 2.7  Neutrophil % 37 - 80 % 47.4  Lymphocyte Count 0.6 - 4.2 x10^9/L 2.3  Lymphocyte % 10 - 50 % 40.4  Monocyte Count 0 - 0.9 x10^9/L 0.5  Monocyte % 0 - 12 % 8.2  Eosinophil Count 0 - 0.70 x10^9/L 0.15  Eosinophil % 0 - 7 % 2.7  Basophil Count 0 - 0.20 x10^9/L 0.05  Basophil % 0 - 2 % 0.9  Immature Granulocyte Count <=0.06 x10^9/L 0.02  Immature Granulocyte % <=0.7 % 0.4   Resulting Agency [facility]   Specimen Collected: 12/29/23 17:00   Performed by: [facility] Last Resulted: 12/29/23 17:50  Received From: [facility]  Result Received: 02/09/24 07:49   Comprehensive Metabolic Panel (CMP) Order: [id number] Component Ref Range & Units 1 mo ago  Sodium 135 - 145 mmol/L 139  Potassium 3.5 - 5.0 mmol/L 4.3  Chloride 98 - 108 mmol/L 104  Carbon Dioxide (CO2) 21 - 30 mmol/L 27  Urea Nitrogen (BUN) 7 - 20 mg/dL 12  Creatinine 0.6 - 1.3 mg/dL 1.3  Glucose 70 - 564 mg/dL 98  Comment: Interpretive Data: Above is the NONFASTING reference range.  Below are the FASTING reference ranges: NORMAL:      70-99 mg/dL PREDIABETES: 332-951 mg/dL DIABETES:    > 884 mg/dL  Calcium 8.7 - 16.6 mg/dL 9.1  AST (Aspartate Aminotransferase) 15 - 41 U/L 17  ALT (Alanine Aminotransferase) 15 - 50 U/L 17  Bilirubin, Total 0.4 - 1.5 mg/dL 0.7  Alk Phos (Alkaline Phosphatase) 24 - 110 U/L 32  Albumin 3.5 - 4.8 g/dL 4.1  Protein, Total 6.2 - 8.1 g/dL 7  Anion Gap 3 - 12 mmol/L 8  BUN/CREA Ratio 6 - 27 9  Glomerular Filtration Rate (eGFR) mL/min/1.73sq m 64  Comment: CKD-EPI (2021) does not include patient's race in the calculation of eGFR. Monitoring changes of plasma creatinine and eGFR over time is useful for monitoring kidney function.  This change was made on 02/19/2021.  Interpretive Ranges for eGFR(CKD-EPI 2021):  eGFR:              > 60 mL/min/1.73 sq m - Normal eGFR:              30 - 59 mL/min/1.73 sq m - Moderately Decreased eGFR:              15 - 29 mL/min/1.73 sq m - Severely Decreased eGFR:              < 15 mL/min/1.73 sq m -  Kidney Failure   Note: These eGFR calculations do not apply in acute situations when eGFR is changing rapidly or in patients on dialysis.  Resulting Agency [facility]   Specimen Collected: 12/29/23 17:00   Performed by: [facility] Last Resulted: 12/29/23 18:26  Received From: [facility]  Result Received: 02/09/24 07:49   Lipid Panel W/Calculated Low Density Lipoprotein (LDL) Cholesterol Order: [id number] Component Ref Range & Units 1 mo ago  Cholesterol, Total mg/dL 932  Comment: The significance of total cholesterol depends on the values of individual components including HDL, LDL, non-HDL, and triglycerides.  LDL Calculated <190 mg/dL 52  Comment: <35 mg/dL      Desired  target for prior heart disease, stroke, and those at high-risk. Even lower levels may be recommended to decrease risk of heart attack and stroke. 70-159 mg/dL   Comprehensive cardiovascular risk assessment is recommended. Statin therapy may be advised based on risk factors. 160-189 mg/dL  Moderately elevated LDL level. Statin therapy recommended if other risk factors present. >=190 mg/dL    Severely elevated LDL level. High long-term risk of heart disease and stroke. High-intensity statin therapy recommended for most people. Consider specialist referral.  *A healthy diet and exercise are recommended for all to reduce heart disease risk. Statin choice should be based on patient preference after patient-provider discussions.   Ref: 2018 ACC/AHA Guideline  HDL mg/dL 29  Comment: People with low HDL levels (see below) are at increased risk of heart disease: <50 mg/dL for Women <46 mg/dL for Men  Triglyceride <962 mg/dL 952  Comment: <841 mg/dL      Normal 324-401 mg/dL   High Triglycerides. Risk of heart disease may be increased. Address reversible causes (eg sugar in foods and beverages, alcohol, and diabetes control). Medication may be appropriate based on other clinical factors. >=500 mg/dL     Very High Triglycerides. Risk of heart disease and pancreatitis increased. Address reversible causes as above. Medication to lower triglycerides usually advised.   *Ranges provided for adults, pediatric guidelines  vary.  Resulting Agency [facility]   Specimen Collected: 12/29/23 17:00   Performed by: [facility] Last Resulted: 12/29/23 18:26  Received From: [facility]  Result Received: 02/09/24 07:49   Hemoglobin A1C Order: [id number] Component Ref Range & Units 1 mo ago  Hemoglobin A1C <5.7 % 6.2 High   Average Blood Glucose (Calculated From HgBA1c Level) mg/dL 403  Resulting Agency [facility]  Narrative Performed by [facility] Between 5.7% and 6.4% is suggestive of Pre-Diabetes or controlled Diabetes. Greater than or equal to 6.5% is suggestive of Diabetes, and if more than one value, diagnostic.  Accuracy may be reduced by anemia, hemoglobinopathy, recent transfusion, sickle cell, artificial heart valve, dialysis, TIPS, severe hyperglycemia, etc.  Specimen Collected: 12/29/23 17:00   Performed by: [facility] Last Resulted: 12/29/23 21:19  Received From: [facility]  Result Received: 02/09/24 07:49   Urinalysis See EPIC and HPI I have reviewed the labs.   Pertinent Imaging: N/A  Assessment & Plan:    1. Testosterone deficiency  -testosterone levels are therapeutic  -H & H WNL -continue testosterone cypionate 200 mg/milliliters, 0.5 cc every 7 days  2. BPH with LUTS -PSA stable -PVR < 300 cc -symptoms -urgency -continue conservative management, avoiding bladder irritants and timed voiding's  3. Erectile dysfunction:    - at goal  4. High risk hematuria  -patient will leave UA after visit     Return in about 6 months (around 08/14/2024) for PSA, HCT, HBG, testosterone, I PSS and SHIM .  These notes generated with voice recognition software. I apologize for typographical errors.  Cloretta Ned  [facility] [address]  [address] [place], [place] [address] [phone]

## 2024-02-15 ENCOUNTER — Ambulatory Visit: Payer: 59 | Admitting: Urology

## 2024-02-15 ENCOUNTER — Inpatient Hospital Stay: Admit: 2024-02-15 | Discharge: 2024-02-15 | Disposition: A | Discharge status: Home / Self Care | Payer: 59

## 2024-02-15 ENCOUNTER — Other Ambulatory Visit
Admission: RE | Admit: 2024-02-15 | Discharge: 2024-02-15 | Disposition: A | Discharge status: Home / Self Care | Payer: 59 | Attending: Urology | Admitting: Urology

## 2024-02-15 ENCOUNTER — Encounter: Payer: Self-pay | Admitting: Urology

## 2024-02-15 VITALS — BP 133/83 | HR 78 | Ht 71.0 in | Wt 180.0 lb

## 2024-02-15 DIAGNOSIS — E291 Testicular hypofunction: Secondary | ICD-10-CM | POA: Diagnosis not present

## 2024-02-15 DIAGNOSIS — N401 Enlarged prostate with lower urinary tract symptoms: Secondary | ICD-10-CM

## 2024-02-15 DIAGNOSIS — R319 Hematuria, unspecified: Secondary | ICD-10-CM | POA: Insufficient documentation

## 2024-02-15 DIAGNOSIS — N138 Other obstructive and reflux uropathy: Secondary | ICD-10-CM

## 2024-02-15 DIAGNOSIS — N529 Male erectile dysfunction, unspecified: Secondary | ICD-10-CM | POA: Diagnosis not present

## 2024-02-15 LAB — URINALYSIS, COMPLETE (UACMP) WITH MICROSCOPIC
Bilirubin Urine: NEGATIVE
Glucose, UA: NEGATIVE mg/dL
Hgb urine dipstick: NEGATIVE
Ketones, ur: NEGATIVE mg/dL
Leukocytes,Ua: NEGATIVE
Nitrite: NEGATIVE
Protein, ur: NEGATIVE mg/dL
Specific Gravity, Urine: >1.03 — ABNORMAL HIGH (ref 1.005–1.030)
pH: 5.5 (ref 5.0–8.0)

## 2024-02-15 MED ORDER — TESTOSTERONE CYPIONATE 200 MG/ML IM SOLN
INTRAMUSCULAR | 0 refills | Status: DC
Start: 2024-02-15 — End: 2024-05-02

## 2024-02-20 ENCOUNTER — Encounter: Payer: Self-pay | Admitting: Emergency Medicine

## 2024-02-20 ENCOUNTER — Other Ambulatory Visit: Payer: Self-pay

## 2024-02-20 ENCOUNTER — Ambulatory Visit
Admission: EM | Admit: 2024-02-20 | Discharge: 2024-02-20 | Disposition: A | Discharge status: Home / Self Care | Attending: Emergency Medicine | Admitting: Emergency Medicine

## 2024-02-20 DIAGNOSIS — J019 Acute sinusitis, unspecified: Secondary | ICD-10-CM

## 2024-02-20 MED ORDER — AMOXICILLIN-POT CLAVULANATE 875-125 MG PO TABS: 1.0000 | ORAL_TABLET | Freq: Two times a day (BID) | ORAL | 0 refills | Status: DC

## 2024-02-20 NOTE — ED Triage Notes (Signed)
 Patient presents to [facility] for evaluation of a small pimple like bump on the inside of his left nostril.  He scraped it to pop it and ever since it has been getting more swollen and red.  Now he has left sided lymph tenderness in his neck, left sided sinus and eye pain.  Took ibuprofen this morning, but states he has been feeling hot this morning

## 2024-02-20 NOTE — ED Provider Notes (Signed)
 Jeffrey Cook    CSN: [id number] Arrival date & time: 02/20/24  1108      History   Chief Complaint Chief Complaint  Patient presents with   Facial Pain    HPI Jeffrey Cook. is a 58 y.o. male.   Patient presents for evaluation of pain to the nose, the left sinus and a bump present to the nose present for 2 days.  Noticed a bump in the nose, attempted to pick and remove but after pain began to occur to the nose into the sinus, progressively worsening.  Denies fever.  Has attempted use of Neosporin.  Past Medical History:  Diagnosis Date   Allergic rhinitis    BPH with obstruction/lower urinary tract symptoms    Chronic headaches    Diabetes mellitus without complication (HCC)    Diverticulosis    Dyslipidemia    Erectile dysfunction    Fatty liver    GERD (gastroesophageal reflux disease)    Hypogonadism in male    Personal history of multiple concussions    Steatohepatitis, non-alcoholic     Patient Active Problem List   Diagnosis Date Noted   Infective right otitis media 06/02/2023   COVID-19 06/02/2023   Chronic prostatitis 05/28/2018   Abnormal ECG 05/04/2018   Chronic pain of left knee 10/08/2017   Impingement syndrome of left shoulder 10/08/2017   Numbness and tingling of upper extremity 10/08/2017   Migraine without aura and without status migrainosus, not intractable 09/23/2017   Erectile dysfunction of non-organic origin 04/14/2016   Non-seasonal allergic rhinitis 03/23/2016   Hypogonadism in male 10/17/2015   BPH with obstruction/lower urinary tract symptoms 10/17/2015   Balanitis 10/17/2015   Polypharmacy 04/18/2015   Encounter for long-term (current) use of medications 04/18/2015   Muscle ache 10/13/2014   Avitaminosis D 03/29/2014   Abscess or cellulitis of leg 11/08/2012   Fatty liver disease, nonalcoholic 01/07/2012   Acid reflux 01/07/2012   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 Authorizing Provider  amoxicillin-clavulanate (AUGMENTIN) 875-125 MG tablet Take 1 tablet by mouth every 12 (twelve) hours. 02/20/24  Yes Salli Quarry R, NP  aspirin 81 MG tablet Take 81 mg by mouth daily.    [provider]  cholecalciferol (VITAMIN D) 400 UNITS TABS tablet Take 1,000 Units by mouth daily.    [provider]  Continuous Blood Gluc Sensor (FREESTYLE LIBRE 3 SENSOR) MISC USE AS DIRECTED EVERY 14 DAYS 12/24/22   [provider]  Dulaglutide 0.75 MG/0.5ML SOPN Inject into the skin. 01/07/23   [provider]  fenofibrate 160 MG tablet Take 160 mg by mouth daily.    [provider]  lisinopril (PRINIVIL,ZESTRIL) 5 MG tablet Take 5 mg by mouth daily.    [provider]  metFORMIN (GLUCOPHAGE-XR) 500 MG 24 hr tablet Take by mouth. 01/07/23   [provider]  NEEDLE, DISP, 18 G (BD DISP NEEDLES) 18G X 1-1/2" MISC 1 mg by Does not apply route every 14 (fourteen) days. 04/26/23   Michiel Cowboy A, PA-C  NEEDLE, DISP, 21 G (BD DISP NEEDLES) 21G X 1-1/2" MISC 1 mg by Does not apply  route every 14 (fourteen) days. 04/26/23   Michiel Cowboy A, PA-C  propranolol (INDERAL) 10 MG tablet Take by mouth. 09/23/17   [provider]  rosuvastatin (CRESTOR) 10 MG tablet Take 1 tablet by mouth daily. 01/07/23 02/15/24  [provider]  sildenafil (REVATIO) 20 MG tablet Take 3 to 5 tablets two hours before intercouse on an empty stomach.  Do not take with nitrates. 07/15/21   Michiel Cowboy A, PA-C  SUMAtriptan (IMITREX) 50 MG tablet Take by mouth. 11/28/20   [provider]  Syringe, Disposable, (2-3CC SYRINGE) 3 ML MISC 1 mg by Does not apply route every 14  (fourteen) days. 04/26/23   Michiel Cowboy A, PA-C  testosterone cypionate (DEPOTESTOSTERONE CYPIONATE) 200 MG/ML injection ADMINISTER 0.5 ML IN THE MUSCLE EVERY WEEK 02/15/24   McGowan, Carollee Herter A, PA-C  VASCEPA 1 g CAPS TK 2 CS PO BID 09/26/17   [provider]  vitamin B-12 (CYANOCOBALAMIN) 1000 MCG tablet Take by mouth.    [provider]  vitamin E 400 UNIT capsule Take 800 Units by mouth daily.    [provider]    Family History Family History  Problem Relation Age of Onset   Hypertension Mother    Alcohol abuse Mother    Diabetes Mother    Stroke Mother    Transient ischemic attack Father    Hyperlipidemia Father    Melanoma Father    Heart attack Father    Kidney cancer Neg Hx    Prostate cancer Neg Hx    Bladder Cancer Neg Hx     Social History Social History   Tobacco Use   Smoking status: Never    Passive exposure: Never   Smokeless tobacco: Former  Building services engineer status: Never Used  Substance Use Topics   Alcohol use: Yes    Alcohol/week: 0.0 standard drinks of alcohol    Comment: occasional   Drug use: No     Allergies   Codeine, Fenofibrate, Fish oil, Niacin, Niacin and related, Prednisone, and Tricor [fenofibrate]   Review of Systems Review of Systems   Physical Exam Triage Vital Signs ED Triage Vitals  Encounter Vitals Group     BP 02/20/24 1233 (!) 130/94     Systolic BP Percentile --      Diastolic BP Percentile --      Pulse Rate 02/20/24 1233 92     Resp 02/20/24 1233 16     Temp 02/20/24 1233 98.7 F (37.1 C)     Temp Source 02/20/24 1233 Oral     SpO2 02/20/24 1233 96 %     Weight --      Height --      Head Circumference --      Peak Flow --      Pain Score 02/20/24 1234 7     Pain Loc --      Pain Education --      Exclude from Growth Chart --    No data found.  Updated Vital Signs BP (!) 130/94 (BP Location: Left Arm)   Pulse 92   Temp 98.7 F (37.1 C) (Oral)   Resp 16   SpO2 96%    Visual Acuity Right Eye Distance:   Left Eye Distance:   Bilateral Distance:    Right Eye Near:   Left Eye Near:    Bilateral Near:     Physical Exam Constitutional:      Appearance: Normal appearance.  HENT:  Nose:     Left Sinus: Maxillary sinus tenderness present.      Comments: Flesh tone papule present to the opening of the left ear, nose bridge erythematous and tender to touch, mild swelling present,  Lymphadenopathy:     Cervical: Cervical adenopathy present.  Neurological:     Mental Status: He is alert.      UC Treatments / Results  Labs (all labs ordered are listed, but only abnormal results are displayed) Labs Reviewed - No data to display  EKG   Radiology No results found.  Procedures Procedures (including critical care time)  Medications Ordered in UC Medications - No data to display  Initial Impression / Assessment and Plan / UC Course  I have reviewed the triage vital signs and the nursing notes.  Pertinent labs & imaging results that were available during my care of the patient were reviewed by me and considered in my medical decision making (see chart for details).  Acute nonrecurrent sinusitis  Papule to the nose,does not seem to be infected however there is erythema and swelling to the bridge as well as tenderness throughout the sinuses and lymphadenopathy, and do have concern with infection most likely due to to opening the skin when picking, prescribed Augmentin and recommended supportive care with follow-up if no improvement seen Final Clinical Impressions(s) / UC Diagnoses   Final diagnoses:  Acute non-recurrent sinusitis, unspecified location     Discharge Instructions      Today your nose has been evaluated, redness and pain are consistent with infection and therefore antibiotic has been initiated  Begin Augmentin every morning and evening for 7 days  May hold warm compresses over the affected area 10 to 15-minute  intervals  Avoid picking or touching the area to avoid additional irritation  Please follow up with ear nose and throat or your primary doctor if no improvement seen    ED Prescriptions     Medication Sig Dispense Auth. Provider   amoxicillin-clavulanate (AUGMENTIN) 875-125 MG tablet Take 1 tablet by mouth every 12 (twelve) hours. 14 tablet Deontra Pereyra, Elita Boone, NP      PDMP not reviewed this encounter.   Valinda Hoar, NP 02/20/24 1426

## 2024-02-20 NOTE — Discharge Instructions (Addendum)
 Today your nose has been evaluated, redness and pain are consistent with infection and therefore antibiotic has been initiated  Begin Augmentin every morning and evening for 7 days  May hold warm compresses over the affected area 10 to 15-minute intervals  Avoid picking or touching the area to avoid additional irritation  Please follow up with ear nose and throat or your primary doctor if no improvement seen

## 2024-02-29 ENCOUNTER — Telehealth: Payer: Self-pay | Admitting: Emergency Medicine

## 2024-02-29 MED ORDER — AMOXICILLIN-POT CLAVULANATE 875-125 MG PO TABS: 1.0000 | ORAL_TABLET | Freq: Two times a day (BID) | ORAL | 0 refills | Status: DC

## 2024-02-29 NOTE — Telephone Encounter (Signed)
 Patient called to clinic, states he was improving with antibiotics, but has a new lump starting on the opposite side of his nose, and is asking for a refill on his antibiotics.  Adrienne APP tells this RN to send refill, patient aware.

## 2024-03-20 ENCOUNTER — Other Ambulatory Visit: Payer: Self-pay | Admitting: Urology

## 2024-03-20 DIAGNOSIS — R3129 Other microscopic hematuria: Secondary | ICD-10-CM

## 2024-03-21 ENCOUNTER — Telehealth: Payer: Self-pay

## 2024-04-06 ENCOUNTER — Ambulatory Visit
Admission: RE | Admit: 2024-04-06 | Discharge: 2024-04-06 | Disposition: A | Discharge status: Home / Self Care | Source: Ambulatory Visit | Attending: Family Medicine | Admitting: Family Medicine

## 2024-04-06 VITALS — BP 124/87 | HR 84 | Temp 98.1°F | Resp 18

## 2024-04-06 DIAGNOSIS — J340 Abscess, furuncle and carbuncle of nose: Secondary | ICD-10-CM

## 2024-04-06 DIAGNOSIS — L02429 Furuncle of limb, unspecified: Secondary | ICD-10-CM

## 2024-04-06 MED ORDER — DOXYCYCLINE HYCLATE 100 MG PO CAPS: 100.0000 mg | ORAL_CAPSULE | Freq: Two times a day (BID) | ORAL | 0 refills | Status: DC

## 2024-04-06 MED ORDER — MUPIROCIN 2 % EX OINT: 1.0000 | TOPICAL_OINTMENT | Freq: Two times a day (BID) | CUTANEOUS | 0 refills | Status: DC

## 2024-04-06 NOTE — Discharge Instructions (Addendum)
 Stop by the pharmacy to pick up your prescriptions. Protect yourself from the sun while taking doxycycline. Follow up with your primary care provider or return to the urgent care, if not improving.

## 2024-04-06 NOTE — ED Provider Notes (Signed)
 [facility]    CSN: [id number] Arrival date & time: 04/06/24  0859      History   Chief Complaint Chief Complaint  Patient presents with   Facial Pain    Entered by patient   Mass    HPI Jamaurion Slemmer. is a 58 y.o. male.   HPI  History obtained from the patient. Paulette presents for reoccurring sores in his nose and now they are under both armpits. Have swelling like a boil on the right nare that is causing his gums and teeth to hurt.  Had similar sx several weeks ago that was treated with antibiotics.     Past Medical History:  Diagnosis Date   Allergic rhinitis    BPH with obstruction/lower urinary tract symptoms    Chronic headaches    Diabetes mellitus without complication (HCC)    Diverticulosis    Dyslipidemia    Erectile dysfunction    Fatty liver    GERD (gastroesophageal reflux disease)    Hypogonadism in male    Personal history of multiple concussions    Steatohepatitis, non-alcoholic     Patient Active Problem List   Diagnosis Date Noted   Infective right otitis media 06/02/2023   COVID-19 06/02/2023   Chronic prostatitis 05/28/2018   Abnormal ECG 05/04/2018   Chronic pain of left knee 10/08/2017   Impingement syndrome of left shoulder 10/08/2017   Numbness and tingling of upper extremity 10/08/2017   Migraine without aura and without status migrainosus, not intractable 09/23/2017   Erectile dysfunction of non-organic origin 04/14/2016   Non-seasonal allergic rhinitis 03/23/2016   Hypogonadism in male 10/17/2015   BPH with obstruction/lower urinary tract symptoms 10/17/2015   Balanitis 10/17/2015   Polypharmacy 04/18/2015   Encounter for long-term (current) use of medications 04/18/2015   Muscle ache 10/13/2014   Avitaminosis D 03/29/2014   Abscess or cellulitis of leg 11/08/2012   Fatty liver disease, nonalcoholic 01/07/2012   Acid reflux 01/07/2012   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 Authorizing Provider  aspirin 81 MG tablet Take 81 mg by mouth daily.   Yes [provider]  cholecalciferol (VITAMIN D) 400 UNITS TABS tablet Take 1,000 Units by mouth daily.   Yes [provider]  doxycycline (VIBRAMYCIN) 100 MG capsule Take 1 capsule (100 mg total) by mouth 2 (two) times daily. 04/06/24  Yes Tupac Jeffus, DO  Dulaglutide 0.75 MG/0.5ML SOPN Inject into the skin. 01/07/23  Yes [provider]  fenofibrate 160 MG tablet Take 160 mg by mouth daily.   Yes [provider]  lisinopril (PRINIVIL,ZESTRIL) 5 MG tablet Take 5 mg by mouth daily.   Yes [provider]  metFORMIN (GLUCOPHAGE-XR) 500 MG 24 hr tablet Take by mouth. 01/07/23  Yes [provider]  mupirocin ointment (BACTROBAN) 2 % Apply 1 Application topically 2 (two) times daily. 04/06/24  Yes Myking Sar, DO  propranolol (INDERAL) 10 MG tablet Take by mouth. 09/23/17  Yes [provider]  rosuvastatin (CRESTOR) 10 MG tablet Take 1 tablet by mouth daily. 01/07/23 04/06/24 Yes [provider]  SUMAtriptan (IMITREX) 50 MG tablet Take by mouth.  11/28/20  Yes [provider]  testosterone cypionate (DEPOTESTOSTERONE CYPIONATE) 200 MG/ML injection ADMINISTER 0.5 ML IN THE MUSCLE EVERY WEEK 02/15/24  Yes McGowan, Cathleen Coach A, PA-C  VASCEPA 1 g CAPS TK 2 CS PO BID 09/26/17  Yes [provider]  vitamin B-12 (CYANOCOBALAMIN) 1000 MCG tablet Take by mouth.   Yes [provider]  vitamin E 400 UNIT capsule Take 800 Units by mouth daily.   Yes [provider]  Continuous Blood Gluc Sensor (FREESTYLE LIBRE 3 SENSOR) MISC USE AS DIRECTED EVERY 14 DAYS 12/24/22    [provider]  NEEDLE, DISP, 18 G (BD DISP NEEDLES) 18G X 1-1/2" MISC 1 mg by Does not apply route every 14 (fourteen) days. 04/26/23   Matilde Son A, PA-C  NEEDLE, DISP, 21 G (BD DISP NEEDLES) 21G X 1-1/2" MISC 1 mg by Does not apply route every 14 (fourteen) days. 04/26/23   Matilde Son A, PA-C  sildenafil (REVATIO) 20 MG tablet Take 3 to 5 tablets two hours before intercouse on an empty stomach.  Do not take with nitrates. 07/15/21   Matilde Son A, PA-C  Syringe, Disposable, (2-3CC SYRINGE) 3 ML MISC 1 mg by Does not apply route every 14 (fourteen) days. 04/26/23   Oda Bence, PA-C    Family History Family History  Problem Relation Age of Onset   Hypertension Mother    Alcohol abuse Mother    Diabetes Mother    Stroke Mother    Transient ischemic attack Father    Hyperlipidemia Father    Melanoma Father    Heart attack Father    Kidney cancer Neg Hx    Prostate cancer Neg Hx    Bladder Cancer Neg Hx     Social History Social History   Tobacco Use   Smoking status: Never    Passive exposure: Never   Smokeless tobacco: Former  Building services engineer status: Never Used  Substance Use Topics   Alcohol use: Yes    Alcohol/week: 0.0 standard drinks of alcohol    Comment: occasional   Drug use: No     Allergies   Codeine, Fenofibrate, Fish oil, Niacin, Niacin and related, Prednisone, and Tricor [fenofibrate]   Review of Systems Review of Systems: negative unless otherwise stated in HPI.      Physical Exam Triage Vital Signs ED Triage Vitals  Encounter Vitals Group     BP 04/06/24 0921 124/87     Systolic BP Percentile --      Diastolic BP Percentile --      Pulse Rate 04/06/24 0921 84     Resp 04/06/24 0921 18     Temp 04/06/24 0921 98.1 F (36.7 C)     Temp Source 04/06/24 0921 Oral     SpO2 04/06/24 0921 96 %     Weight --      Height --      Head Circumference --      Peak Flow --      Pain Score 04/06/24 0922 4     Pain Loc --       Pain Education --      Exclude from Growth Chart --    No data found.  Updated Vital Signs BP 124/87 (BP Location: Left Arm)   Pulse 84   Temp 98.1 F (36.7 C) (Oral)   Resp 18   SpO2 96%   Visual Acuity Right Eye Distance:   Left Eye Distance:   Bilateral  Distance:    Right Eye Near:   Left Eye Near:    Bilateral Near:     Physical Exam GEN:     alert, non-toxic appearing male in no distress    HENT:  mucus membranes moist, moderate erythematous edematous turbinates, right anterior nare boil, erythema with warmth at tip of nose, clear nasal discharge EYES:   no scleral injection or discharge RESP:  no increased work of breathing Skin:   warm and dry, boils in bilateral axilla with surrounding induration and erythema     UC Treatments / Results  Labs (all labs ordered are listed, but only abnormal results are displayed) Labs Reviewed - No data to display  EKG   Radiology No results found.  Procedures Procedures (including critical care time)  Medications Ordered in UC Medications - No data to display  Initial Impression / Assessment and Plan / UC Course  I have reviewed the triage vital signs and the nursing notes.  Pertinent labs & imaging results that were available during my care of the patient were reviewed by me and considered in my medical decision making (see chart for details).       Pt is a 58 y.o. male who presents for skin lesion and nasal pain.  Andru is afebrile here without recent antipyretics. Satting well on room air. Overall pt is non-toxic appearing, well hydrated, without respiratory distress. On exam, he has boils on bilateral axilla one of which may be early abscess.  He has evidence of nasal cellulitis and possible nasal abscess forming on the right. He has no known history if MRSA but spends time in the hospital with his mom. Unfortunately, we do not have a MRSA nasal swab here. Treat with mupirocin ointment and doxycyline. Use  Hibiclens to clean axilla. Typical duration of symptoms discussed.   Return and ED precautions given and voiced understanding. Discussed MDM, treatment plan and plan for follow-up with patient who agrees with plan.     Final Clinical Impressions(s) / UC Diagnoses   Final diagnoses:  Cellulitis of nasal tip  Furuncle of axilla, unspecified laterality     Discharge Instructions      Stop by the pharmacy to pick up your prescriptions. Protect yourself from the sun while taking doxycycline. Follow up with your primary care provider or return to the urgent care, if not improving.        ED Prescriptions     Medication Sig Dispense Auth. Provider   doxycycline (VIBRAMYCIN) 100 MG capsule Take 1 capsule (100 mg total) by mouth 2 (two) times daily. 14 capsule Merit Maybee, DO   mupirocin ointment (BACTROBAN) 2 % Apply 1 Application topically 2 (two) times daily. 22 g Marieann Zipp, DO      PDMP not reviewed this encounter.   Fidel Huddle, DO 04/06/24 1028

## 2024-04-06 NOTE — ED Triage Notes (Signed)
 Pt states he started to get pimple/bump on the inside of his left nostril around 02/20/24, was seen at [facility] and prescribed antibiotics pt states that did help but now having painful one on the inside of right nostril, and under both armpits.

## 2024-04-08 ENCOUNTER — Ambulatory Visit
Admission: RE | Admit: 2024-04-08 | Discharge: 2024-04-08 | Disposition: A | Discharge status: Home / Self Care | Source: Ambulatory Visit | Attending: Urology | Admitting: Urology

## 2024-04-08 DIAGNOSIS — R3129 Other microscopic hematuria: Secondary | ICD-10-CM | POA: Insufficient documentation

## 2024-04-08 LAB — POCT I-STAT CREATININE: Creatinine, Ser: 1.3 mg/dL — ABNORMAL HIGH (ref 0.61–1.24)

## 2024-04-08 MED ORDER — IOHEXOL 300 MG/ML  SOLN
100.0000 mL | Freq: Once | INTRAMUSCULAR | Status: AC | PRN
  Administered 2024-04-08: 100 mL via INTRAVENOUS

## 2024-04-13 ENCOUNTER — Other Ambulatory Visit: Payer: Self-pay | Admitting: Urology

## 2024-04-13 DIAGNOSIS — N401 Enlarged prostate with lower urinary tract symptoms: Secondary | ICD-10-CM

## 2024-04-13 DIAGNOSIS — E291 Testicular hypofunction: Secondary | ICD-10-CM

## 2024-04-20 ENCOUNTER — Other Ambulatory Visit

## 2024-04-20 ENCOUNTER — Other Ambulatory Visit: Payer: Self-pay | Admitting: Urology

## 2024-04-20 DIAGNOSIS — E291 Testicular hypofunction: Secondary | ICD-10-CM

## 2024-04-20 DIAGNOSIS — N138 Other obstructive and reflux uropathy: Secondary | ICD-10-CM

## 2024-04-20 LAB — URINALYSIS, COMPLETE
Bilirubin, UA: NEGATIVE
Glucose, UA: NEGATIVE
Ketones, UA: NEGATIVE
Leukocytes,UA: NEGATIVE
Nitrite, UA: NEGATIVE
Protein,UA: NEGATIVE
RBC, UA: NEGATIVE
Specific Gravity, UA: 1.025 (ref 1.005–1.030)
Urobilinogen, Ur: 0.2 mg/dL (ref 0.2–1.0)
pH, UA: 5.5 (ref 5.0–7.5)

## 2024-04-20 LAB — MICROSCOPIC EXAMINATION

## 2024-04-20 NOTE — Addendum Note (Signed)
 Addended by: Natha Bair on: 04/20/2024 09:03 AM   Modules accepted: Orders

## 2024-04-21 LAB — TESTOSTERONE: Testosterone: 1296 ng/dL — ABNORMAL HIGH (ref 264–916)

## 2024-04-21 LAB — HEMOGLOBIN AND HEMATOCRIT, BLOOD
Hematocrit: 50.1 % (ref 37.5–51.0)
Hemoglobin: 16.4 g/dL (ref 13.0–17.7)

## 2024-04-21 LAB — PSA: Prostate Specific Ag, Serum: 0.4 ng/mL (ref 0.0–4.0)

## 2024-05-02 ENCOUNTER — Ambulatory Visit: Admitting: Urology

## 2024-05-02 ENCOUNTER — Encounter: Payer: Self-pay | Admitting: Urology

## 2024-05-02 VITALS — BP 122/85 | HR 87 | Ht 71.0 in | Wt 182.0 lb

## 2024-05-02 DIAGNOSIS — N529 Male erectile dysfunction, unspecified: Secondary | ICD-10-CM

## 2024-05-02 DIAGNOSIS — R319 Hematuria, unspecified: Secondary | ICD-10-CM | POA: Diagnosis not present

## 2024-05-02 DIAGNOSIS — E291 Testicular hypofunction: Secondary | ICD-10-CM | POA: Diagnosis not present

## 2024-05-02 DIAGNOSIS — N138 Other obstructive and reflux uropathy: Secondary | ICD-10-CM

## 2024-05-02 DIAGNOSIS — N401 Enlarged prostate with lower urinary tract symptoms: Secondary | ICD-10-CM | POA: Diagnosis not present

## 2024-05-02 MED ORDER — BD DISP NEEDLES 18G X 1-1/2" MISC: 1.0000 mg | 0 refills | Status: AC

## 2024-05-02 MED ORDER — SYRINGE 2-3 ML 3 ML MISC: 1.0000 mg | 3 refills | Status: AC

## 2024-05-02 MED ORDER — BD DISP NEEDLES 21G X 1-1/2" MISC: 1.0000 mg | 0 refills | Status: AC

## 2024-05-02 MED ORDER — TESTOSTERONE CYPIONATE 200 MG/ML IM SOLN: INTRAMUSCULAR | 0 refills | Status: DC

## 2024-05-02 NOTE — Progress Notes (Signed)
 05/02/2024 3:22 PM   Jeffrey Cook. 1966/04/30 [id number]  Referring provider: Marcine Severin, MD [address] [place],  [place] [address]  Urological history: 1.  High risk hematuria - non - smoker - CTU (2020) - punctate 1 to 2 mm stone for the inferior pole of the right kidney+ - cysto (2020) - NED  2.  Hypogonadism - Testosterone  level (03/2024) 1296 - Hemoglobin/hematocrit (03/2024) 16.4/50.1 - Testosterone  cypionate 100 mg/milliliters, 0.5 cc every 7 days  3. BPH with LU TS - PSA (03/2024) 0.4  Chief Complaint  Patient presents with   Hypogonadism   HPI: Jeffrey Cook. is a 58 y.o. man who presents today for follow up.    Previous records reviewed.   He had incidental finding of acute microscopic hematuria back in February of 6-10 RBCs.  Repeat CT urogram noted punctate intrarenal calculi bilaterally with no hydronephrosis or enhancing urethral lesions.  Another incidental finding of a 10 mm right renal cyst.  He opted out of a repeat cystoscopy.  A urine cytology sent on Apr 26, 2024 was negative for atypia or malignancy.  A repeat urinalysis in April revealed no microscopic hematuria  He has just noticed a mild increase in urinary urgency.  Patient denies any modifying or aggravating factors.  Patient denies any recent UTI's, gross hematuria, dysuria or suprapubic/flank pain.  Patient denies any fevers, chills, nausea or vomiting.    He does have sildenafil  on hand, but he has not had to take it as his erections have been improving on testosterone  therapy. PMH: Past Medical History:  Diagnosis Date   Allergic rhinitis    BPH with obstruction/lower urinary tract symptoms    Chronic headaches    Diabetes mellitus without complication (HCC)    Diverticulosis    Dyslipidemia    Erectile dysfunction    Fatty liver    GERD (gastroesophageal reflux disease)    Hypogonadism in male    Personal history of multiple concussions    Steatohepatitis,  non-alcoholic     Surgical History: Past Surgical History:  Procedure Laterality Date   LIVER BIOPSY  06/10/2009   MYELOGRAM     NASAL SINUS SURGERY      Home Medications:  Allergies as of 05/02/2024       Reactions   Codeine    Fenofibrate Other (See Comments)   Fish Oil    Other reaction(s): Other (See Comments) Dyspepsia, foul taste, loose stools - tolerates Lovaza   Niacin    Other reaction(s): Liver Disorder He has a history of liver test elevations while taking niacin prior to 2006. No niacin trial since then. 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   Niacin And Related    Prednisone    Headache   Tricor [fenofibrate]         Medication List        Accurate as of May 02, 2024  3:22 PM. If you have any questions, ask your nurse or doctor.          STOP taking these medications    mupirocin  ointment 2 % Commonly known as: BACTROBAN        TAKE these medications    2-3CC SYRINGE 3 ML Misc 1 mg by Does not apply route every 14 (fourteen) days.   aspirin 81 MG tablet Take 81 mg by mouth daily.   BD Disp Needles 18G X 1-1/2" Misc Generic drug: NEEDLE (DISP) 18 G 1 mg by Does  not apply route every 14 (fourteen) days.   BD Disp Needles 21G X 1-1/2" Misc Generic drug: NEEDLE (DISP) 21 G 1 mg by Does not apply route every 14 (fourteen) days.   buPROPion 200 MG 12 hr tablet Commonly known as: WELLBUTRIN SR Take 200 mg by mouth 2 (two) times daily.   cholecalciferol 10 MCG (400 UNIT) Tabs tablet Commonly known as: VITAMIN D3 Take 1,000 Units by mouth daily.   cyanocobalamin 1000 MCG tablet Commonly known as: VITAMIN B12 Take by mouth.   doxycycline  100 MG capsule Commonly known as: VIBRAMYCIN  Take 1 capsule (100 mg total) by mouth 2 (two) times daily.   fenofibrate 160 MG tablet Take 160 mg by mouth daily.   FreeStyle Libre 3 Sensor Misc USE AS DIRECTED EVERY 14 DAYS   lisinopril 5 MG tablet Commonly known as: ZESTRIL Take  5 mg by mouth daily.   metFORMIN 500 MG 24 hr tablet Commonly known as: GLUCOPHAGE-XR Take by mouth.   propranolol 10 MG tablet Commonly known as: INDERAL Take by mouth.   rosuvastatin 10 MG tablet Commonly known as: CRESTOR Take 1 tablet by mouth daily.   sildenafil  20 MG tablet Commonly known as: REVATIO  Take 3 to 5 tablets two hours before intercouse on an empty stomach.  Do not take with nitrates.   SUMAtriptan 50 MG tablet Commonly known as: IMITREX Take by mouth.   testosterone  cypionate 200 MG/ML injection Commonly known as: DEPOTESTOSTERONE CYPIONATE ADMINISTER 0.5 ML IN THE MUSCLE EVERY WEEK   Trulicity 1.5 MG/0.5ML Soaj Generic drug: Dulaglutide Inject 1.5 mg into the skin once a week. What changed: Another medication with the same name was removed. Continue taking this medication, and follow the directions you see here.   Vascepa 1 g capsule Generic drug: icosapent Ethyl TK 2 CS PO BID   vitamin E 180 MG (400 UNITS) capsule Take 800 Units by mouth daily.        Allergies:  Allergies  Allergen Reactions   Codeine    Fenofibrate Other (See Comments)   Fish Oil     Other reaction(s): Other (See Comments) Dyspepsia, foul taste, loose stools - tolerates Lovaza   Niacin     Other reaction(s): Liver Disorder He has a history of liver test elevations while taking niacin prior to 2006. No niacin trial since then. 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   Niacin And Related    Prednisone     Headache   Tricor [Fenofibrate]     Family History: Family History  Problem Relation Age of Onset   Hypertension Mother    Alcohol abuse Mother    Diabetes Mother    Stroke Mother    Transient ischemic attack Father    Hyperlipidemia Father    Melanoma Father    Heart attack Father    Kidney cancer Neg Hx    Prostate cancer Neg Hx    Bladder Cancer Neg Hx     Social History:  reports that he has never smoked. He has never been exposed to  tobacco smoke. He has quit using smokeless tobacco. He reports current alcohol use. He reports that he does not use drugs.  ROS: Pertinent ROS in HPI  Physical Exam: BP 122/85 (BP Location: Left Arm, Patient Position: Sitting, Cuff Size: Normal)   Pulse 87   Ht 5\' 11"  (1.803 m)   Wt 182 lb (82.6 kg)   SpO2 96%   BMI 25.38 kg/m   Constitutional:  Well nourished. Alert and oriented, No acute distress. HEENT: [place] AT, moist mucus membranes.  Trachea midline Cardiovascular: No clubbing, cyanosis, or edema. Respiratory: Normal respiratory effort, no increased work of breathing. Neurologic: Grossly intact, no focal deficits, moving all 4 extremities. Psychiatric: Normal mood and affect.  Laboratory Data: Results for orders placed or performed in visit on 04/20/24  Testosterone    Collection Time: 04/20/24  9:09 AM  Result Value Ref Range   Testosterone  1,296 (H) 264 - 916 ng/dL  PSA   Collection Time: 04/20/24  9:09 AM  Result Value Ref Range   Prostate Specific Ag, Serum 0.4 0.0 - 4.0 ng/mL  Hemoglobin and hematocrit, blood   Collection Time: 04/20/24  9:09 AM  Result Value Ref Range   Hemoglobin 16.4 13.0 - 17.7 g/dL   Hematocrit 16.1 09.6 - 51.0 %  Microscopic Examination   Collection Time: 04/20/24  9:19 AM   Urine  Result Value Ref Range   WBC, UA 0-5 0 - 5 /hpf   RBC, Urine 0-2 0 - 2 /hpf   Epithelial Cells (non renal) 0-10 0 - 10 /hpf   Mucus, UA Present (A) Not Estab.   Bacteria, UA Few None seen/Few  Urinalysis, Complete   Collection Time: 04/20/24  9:19 AM  Result Value Ref Range   Specific Gravity, UA 1.025 1.005 - 1.030   pH, UA 5.5 5.0 - 7.5   Color, UA Yellow Yellow   Appearance Ur Clear Clear   Leukocytes,UA Negative Negative   Protein,UA Negative Negative/Trace   Glucose, UA Negative Negative   Ketones, UA Negative Negative   RBC, UA Negative Negative   Bilirubin, UA Negative Negative   Urobilinogen, Ur 0.2 0.2 - 1.0 mg/dL   Nitrite, UA Negative  Negative   Microscopic Examination See below:     Lab Results  Component Value Date   WBC 5.2 04/24/2017   HGB 16.4 04/20/2024   HCT 50.1 04/20/2024   MCV 79.3 (L) 04/24/2017   PLT 159 04/24/2017    Lab Results  Component Value Date   CREATININE 1.30 (H) 04/08/2024   Lab Results  Component Value Date   TESTOSTERONE  1,296 (H) 04/20/2024   Urinalysis See EPIC and HPI  I have reviewed the labs.   Pertinent Imaging: CLINICAL DATA:  microscopic hematuria   EXAM: CT ABDOMEN AND PELVIS WITHOUT AND WITH CONTRAST   TECHNIQUE: Multidetector CT imaging of the abdomen and pelvis was performed following the standard protocol before and following the bolus administration of intravenous contrast.   RADIATION DOSE REDUCTION: This exam was performed according to the departmental dose-optimization program which includes automated exposure control, adjustment of the mA and/or kV according to patient size and/or use of iterative reconstruction technique.   CONTRAST:  OMNIPAQUE  IOHEXOL  300 MG/ML  SOLN   COMPARISON:  CT abdomen and pelvis 07/25/2019.   FINDINGS: Lower chest: No acute abnormality.   Hepatobiliary: Diffuse parenchymal hypoattenuation consistent with steatosis. No focal liver lesion. Normal gallbladder. No biliary ductal dilation.   Pancreas: Unremarkable. No pancreatic ductal dilatation or surrounding inflammatory changes.   Spleen: Normal in size without focal abnormality.   Adrenals/Urinary Tract: Normal adrenal glands bilaterally. Symmetric renal size and enhancement. No hydronephrosis. Punctate 1-2 mm nonobstructing lower pole intrarenal calculi. Hemorrhagic/proteinaceous 10 mm right cortical cyst. Unremarkable urinary bladder. No enhancing urothelial lesion.   Stomach/Bowel: Stomach is within normal limits. No right lower quadrant inflammatory changes. Colonic diverticulosis. No evidence of bowel wall thickening, distention, or inflammatory  changes.  Vascular/Lymphatic: Aortic atherosclerosis. No enlarged abdominal or pelvic lymph nodes.   Reproductive: Prostate is unremarkable.   Other: No abdominal wall hernia or abnormality. No abdominopelvic ascites.   Musculoskeletal: No acute or significant osseous findings. Multilevel degenerative changes.   IMPRESSION: 1. Punctate, nonobstructing intrarenal calculi bilaterally. No hydronephrosis or enhancing urothelial lesion. 2. Hepatic steatosis.     Electronically Signed   By: Rox Cope M.D.   On: 04/11/2024 14:13 I have independently reviewed the films.  See HPI.  Assessment & Plan:    1. High risk hematuria - Non-smoker - CTU demonstrated punctate stones - Urine cytology negative for malignancy - repeat UA next year - report any gross heme  2.  Hypogonadism - Serum testosterone  is supratherapeutic, but he had his blood drawn just 2 days after an injection - Hemoglobin/hematocrit normal - Continue testosterone  cypionate 200 mg/milliliters, 0.5 cc every 7 days, he is switching his pharmacies to the [facility] in [place], so I placed a new prescription there - We will repeat his testosterone , hemoglobin and hematocrit levels in 3 months have asked for him to he has blood drawn about 4 days after an injection  3. BPH with LU TS - PSA stable  4. ED - At goal on testosterone  therapy   Return in about 3 months (around 08/02/2024) for labs -testosterone , hemoglobin and hematocrit.  These notes generated with voice recognition software. I apologize for typographical errors.  Briant Camper  [facility] [address]  [address] [place], [place] [address] [phone]

## 2024-05-13 ENCOUNTER — Ambulatory Visit
Admission: RE | Admit: 2024-05-13 | Discharge: 2024-05-13 | Disposition: A | Discharge status: Home / Self Care | Source: Ambulatory Visit | Attending: Emergency Medicine | Admitting: Emergency Medicine

## 2024-05-13 VITALS — BP 129/77 | HR 77 | Temp 98.4°F | Resp 15 | Ht 71.0 in | Wt 182.1 lb

## 2024-05-13 DIAGNOSIS — L739 Follicular disorder, unspecified: Secondary | ICD-10-CM

## 2024-05-13 MED ORDER — DOXYCYCLINE HYCLATE 100 MG PO CAPS: 100.0000 mg | ORAL_CAPSULE | Freq: Two times a day (BID) | ORAL | 0 refills | Status: DC

## 2024-05-13 NOTE — Discharge Instructions (Signed)
 Do not shave armpits Do not use deodorant while area is affected Take doxycycline  as directed Follow up with PCP for recheck, follow up with dermatologist-call for appt, may cause photosensitivity Go to Er for new or worsening issues(fever,Nausea,vomiting,etc)

## 2024-05-13 NOTE — ED Triage Notes (Signed)
 Patient was previously treated for sores in his nose and skin with Doxycycline .  Patient reports abscess in his left armpit that started on Sunday.  Patient did puncture the abscess at home.  Patient reports thick drainage from the area.

## 2024-05-13 NOTE — ED Provider Notes (Signed)
 [facility]    CSN: [id number] Arrival date & time: 05/13/24  1115      History   Chief Complaint Chief Complaint  Patient presents with   Skin Ulcer    Appointment    HPI Jeffrey Cook. is a 58 y.o. male.   58 year old male, Jeffrey Cook, Frith., presents to urgent care for evaluation of left armpit abscess that started 6 days prior(pt states he punctured it at home and got thick drainage). Pt states he previously was sen last month and treated for nose sores and armpit abscess with doxycycline  and mupirocin  oitnment.  PMH: Diabetes, GERD, diverticulosis, BPH, fatty liver, MRSA  The history is provided by the patient. No language interpreter was used.    Past Medical History:  Diagnosis Date   Allergic rhinitis    BPH with obstruction/lower urinary tract symptoms    Chronic headaches    Diabetes mellitus without complication (HCC)    Diverticulosis    Dyslipidemia    Erectile dysfunction    Fatty liver    GERD (gastroesophageal reflux disease)    Hypogonadism in male    Personal history of multiple concussions    Steatohepatitis, non-alcoholic     Patient Active Problem List   Diagnosis Date Noted   Folliculitis 05/13/2024   Infective right otitis media 06/02/2023   COVID-19 06/02/2023   Chronic prostatitis 05/28/2018   Abnormal ECG 05/04/2018   Chronic pain of left knee 10/08/2017   Impingement syndrome of left shoulder 10/08/2017   Numbness and tingling of upper extremity 10/08/2017   Migraine without aura and without status migrainosus, not intractable 09/23/2017   Erectile dysfunction of non-organic origin 04/14/2016   Non-seasonal allergic rhinitis 03/23/2016   Hypogonadism in male 10/17/2015   BPH with obstruction/lower urinary tract symptoms 10/17/2015   Balanitis 10/17/2015   Polypharmacy 04/18/2015   Encounter for long-term (current) use of medications 04/18/2015   Muscle ache 10/13/2014   Avitaminosis D 03/29/2014   Abscess or  cellulitis of leg 11/08/2012   Fatty liver disease, nonalcoholic 01/07/2012   Acid reflux 01/07/2012   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 Authorizing Provider  aspirin 81 MG tablet Take 81 mg by mouth daily.    [provider]  buPROPion (WELLBUTRIN SR) 200 MG 12 hr tablet Take 200 mg by mouth 2 (two) times daily. 03/09/24   [provider]  cholecalciferol (VITAMIN D) 400 UNITS TABS tablet Take 1,000 Units by mouth daily.    [provider]  Continuous Blood Gluc Sensor (FREESTYLE LIBRE 3 SENSOR) MISC USE AS DIRECTED EVERY 14 DAYS 12/24/22   [provider]  doxycycline  (VIBRAMYCIN ) 100 MG capsule Take 1 capsule (100 mg total) by mouth 2 (two) times daily. 05/13/24   Samar Venneman, NP  fenofibrate 160 MG tablet Take 160 mg by mouth daily.    [provider]  lisinopril (PRINIVIL,ZESTRIL) 5 MG tablet Take 5 mg by mouth daily.    [provider]  metFORMIN (GLUCOPHAGE-XR) 500 MG 24 hr tablet Take by mouth. 01/07/23   [provider]  NEEDLE, DISP, 18 G (BD DISP NEEDLES) 18G X  1-1/2" MISC 1 mg by Does not apply route every 14 (fourteen) days. 05/02/24   McGowan, Cathleen Coach A, PA-C  NEEDLE, DISP, 21 G (BD DISP NEEDLES) 21G X 1-1/2" MISC 1 mg by Does not apply route every 14 (fourteen) days. 05/02/24   Matilde Son A, PA-C  propranolol (INDERAL) 10 MG tablet Take by mouth. 09/23/17   [provider]  rosuvastatin (CRESTOR) 10 MG tablet Take 1 tablet by mouth daily. 01/07/23 04/06/24  [provider]  sildenafil  (REVATIO ) 20 MG tablet Take 3 to 5 tablets two hours before intercouse on an empty stomach.  Do not take  with nitrates. 07/15/21   Matilde Son A, PA-C  SUMAtriptan (IMITREX) 50 MG tablet Take by mouth. 11/28/20   [provider]  Syringe, Disposable, (2-3CC SYRINGE) 3 ML MISC 1 mg by Does not apply route every 14 (fourteen) days. 05/02/24   McGowan, Cathleen Coach A, PA-C  testosterone  cypionate (DEPOTESTOSTERONE CYPIONATE) 200 MG/ML injection ADMINISTER 0.5 ML IN THE MUSCLE EVERY WEEK 05/02/24   McGowan, Cathleen Coach A, PA-C  TRULICITY 1.5 MG/0.5ML SOAJ Inject 1.5 mg into the skin once a week. 04/28/24   [provider]  VASCEPA 1 g CAPS TK 2 CS PO BID 09/26/17   [provider]  vitamin B-12 (CYANOCOBALAMIN) 1000 MCG tablet Take by mouth.    [provider]  vitamin E 400 UNIT capsule Take 800 Units by mouth daily.    [provider]    Family History Family History  Problem Relation Age of Onset   Hypertension Mother    Alcohol abuse Mother    Diabetes Mother    Stroke Mother    Transient ischemic attack Father    Hyperlipidemia Father    Melanoma Father    Heart attack Father    Kidney cancer Neg Hx    Prostate cancer Neg Hx    Bladder Cancer Neg Hx     Social History Social History   Tobacco Use   Smoking status: Never    Passive exposure: Never   Smokeless tobacco: Former  Building services engineer status: Never Used  Substance Use Topics   Alcohol use: Yes    Alcohol/week: 0.0 standard drinks of alcohol    Comment: occasional   Drug use: No     Allergies   Codeine, Fenofibrate, Fish oil, Niacin, Niacin and related, Prednisone, and Tricor [fenofibrate]   Review of Systems Review of Systems  Constitutional:  Negative for fever.  Skin:  Positive for color change and wound.  Psychiatric/Behavioral:  The patient is nervous/anxious.   All other systems reviewed and are negative.    Physical Exam Triage Vital Signs ED Triage Vitals  Encounter Vitals Group     BP      Systolic BP Percentile      Diastolic BP Percentile      Pulse       Resp      Temp      Temp src      SpO2      Weight      Height      Head Circumference      Peak Flow      Pain Score      Pain Loc      Pain Education      Exclude from Growth Chart    No data found.  Updated Vital Signs BP 129/77 (BP Location: Right Arm)   Pulse 77   Temp 98.4 F (36.9  C) (Oral)   Resp 15   Ht 5\' 11"  (1.803 m)   Wt 182 lb 1.6 oz (82.6 kg)   SpO2 95%   BMI 25.40 kg/m   Visual Acuity Right Eye Distance:   Left Eye Distance:   Bilateral Distance:    Right Eye Near:   Left Eye Near:    Bilateral Near:     Physical Exam Vitals and nursing note reviewed.  Constitutional:      Appearance: He is well-developed and well-groomed.  HENT:     Head: Normocephalic.     Nose:     Comments: No lesions or swelling to nose Cardiovascular:     Rate and Rhythm: Normal rate.  Pulmonary:     Effort: Pulmonary effort is normal.  Skin:    General: Skin is warm.     Capillary Refill: Capillary refill takes less than 2 seconds.       Neurological:     General: No focal deficit present.     Mental Status: He is alert and oriented to person, place, and time.     GCS: GCS eye subscore is 4. GCS verbal subscore is 5. GCS motor subscore is 6.  Psychiatric:        Behavior: Behavior is cooperative.      UC Treatments / Results  Labs (all labs ordered are listed, but only abnormal results are displayed) Labs Reviewed - No data to display  EKG   Radiology No results found.  Procedures Procedures (including critical care time)  Medications Ordered in UC Medications - No data to display  Initial Impression / Assessment and Plan / UC Course  I have reviewed the triage vital signs and the nursing notes.  Pertinent labs & imaging results that were available during my care of the patient were reviewed by me and considered in my medical decision making (see chart for details).    Discussed exam findings and plan of care with patient scripted  doxycycline  to cover for folliculitis, strict go to ER precautions given, patient verbalized understanding this provider, referred to dermatologist for follow-up.   Ddx: Folliculitis, cellulitis, hidradenitis,  Final Clinical Impressions(s) / UC Diagnoses   Final diagnoses:  Folliculitis     Discharge Instructions      Do not shave armpits Do not use deodorant while area is affected Take doxycycline  as directed Follow up with PCP for recheck, follow up with dermatologist-call for appt, may cause photosensitivity Go to Er for new or worsening issues(fever,Nausea,vomiting,etc)   ED Prescriptions     Medication Sig Dispense Auth. Provider   doxycycline  (VIBRAMYCIN ) 100 MG capsule Take 1 capsule (100 mg total) by mouth 2 (two) times daily. 14 capsule Oksana Deberry, NP      PDMP not reviewed this encounter.   Peter Brands, NP 05/13/24 [phone]

## 2024-07-12 NOTE — Telephone Encounter (Signed)
 Error

## 2024-08-06 ENCOUNTER — Encounter: Payer: Self-pay | Admitting: Emergency Medicine

## 2024-08-06 ENCOUNTER — Ambulatory Visit
Admission: EM | Admit: 2024-08-06 | Discharge: 2024-08-06 | Disposition: A | Discharge status: Home / Self Care | Attending: Nurse Practitioner | Admitting: Nurse Practitioner

## 2024-08-06 DIAGNOSIS — L0211 Cutaneous abscess of neck: Secondary | ICD-10-CM | POA: Diagnosis not present

## 2024-08-06 MED ORDER — DOXYCYCLINE HYCLATE 100 MG PO CAPS: 100.0000 mg | ORAL_CAPSULE | Freq: Two times a day (BID) | ORAL | 0 refills | Status: AC

## 2024-08-06 NOTE — Discharge Instructions (Addendum)
 You were seen today for a boil-like lesion on your neck that is most consistent with a MRSA-related skin infection. At this time, the area is firm and not ready to be drained. You have been prescribed doxycycline to treat the infection. Take this medication exactly as directed and finish the entire course, even if the area begins to look better. Apply warm, moist compresses to the area several times a day using a wet washcloth and a low-heat heating pad to help bring the infection to a head. Do not try to squeeze, pop, or drain the lesion yourself, as this can make the infection worse or cause it to spread. If the area drains on its own, gently wash it with mild soap and water, apply a clean dressing, and wash your hands afterward. Monitor for signs that the infection is getting worse, such as increasing redness, swelling, pain, warmth, pus drainage, fever, or chills. Follow up in two days if the area has not improved or sooner if symptoms worsen. Seek immediate medical attention if you develop high fever, rapid swelling, spreading redness, severe pain, or feel generally unwell.

## 2024-08-06 NOTE — ED Triage Notes (Signed)
 Patient states that red painful abscess on the left side of his neck since yesterday.  Patient denies drainage from the area.  Patient would like it to be lanced and drained.  Patient denies any fevers.

## 2024-08-06 NOTE — ED Provider Notes (Signed)
 [facility]    CSN: [id number] Arrival date & time: 08/06/24  1036      History   Chief Complaint Chief Complaint  Patient presents with   Abscess    Left neck    HPI Jeffrey Cook. is a 58 y.o. male.   Discussed the use of AI scribe software for clinical note transcription with the patient, who gave verbal consent to proceed.   Patient presents with a suspected MRSA infection, manifesting as a boil-like abscess on the left side of his neck. 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 Typically, the patient would drain these boils himself, but he is hesitant to do so with the neck lesion due to its location. He mentions being prescribed doxycycline in May, which was effective, while a prior course of Augmentin was not helpful. He denies any fevers, chills or body aches. He is diabetic with last A1C of 6.1 %.   The following portions of the patient's history were reviewed and updated as appropriate: allergies, current medications, past family history, past medical history, past social history, past surgical history, and problem list.        Past Medical History:  Diagnosis Date   Allergic rhinitis    BPH with obstruction/lower urinary tract symptoms    Chronic headaches    Diabetes mellitus without complication (HCC)    Diverticulosis    Dyslipidemia    Erectile dysfunction    Fatty liver    GERD (gastroesophageal reflux disease)    Hypogonadism in male    Personal history of multiple concussions    Steatohepatitis, non-alcoholic     Patient Active Problem List   Diagnosis Date Noted   Folliculitis 05/13/2024   Infective right otitis media 06/02/2023   COVID-19 06/02/2023   Chronic prostatitis 05/28/2018   Abnormal ECG 05/04/2018   Chronic pain of left knee 10/08/2017   Impingement syndrome of left shoulder 10/08/2017   Numbness and tingling of upper extremity 10/08/2017    Migraine without aura and without status migrainosus, not intractable 09/23/2017   Erectile dysfunction of non-organic origin 04/14/2016   Non-seasonal allergic rhinitis 03/23/2016   Hypogonadism in male 10/17/2015   BPH with obstruction/lower urinary tract symptoms 10/17/2015   Balanitis 10/17/2015   Polypharmacy 04/18/2015   Encounter for long-term (current) use of medications 04/18/2015   Muscle ache 10/13/2014   Avitaminosis D 03/29/2014   Abscess or cellulitis of leg 11/08/2012   Fatty liver disease, nonalcoholic 01/07/2012   Acid reflux 01/07/2012   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 Authorizing Provider  doxycycline (VIBRAMYCIN) 100 MG capsule Take 1 capsule (100 mg total) by mouth 2 (two) times daily. 08/06/24  Yes Iola Lukes, FNP  aspirin 81 MG tablet Take 81 mg by mouth daily.    [provider]  buPROPion (WELLBUTRIN SR) 200 MG 12 hr tablet Take 200 mg by mouth 2 (two) times daily. 03/09/24   [provider]  cholecalciferol (VITAMIN D) 400 UNITS TABS tablet Take 1,000 Units by mouth daily.    [provider]  Continuous Blood Gluc  Sensor (FREESTYLE LIBRE 3 SENSOR) MISC USE AS DIRECTED EVERY 14 DAYS 12/24/22   [provider]  fenofibrate 160 MG tablet Take 160 mg by mouth daily.    [provider]  lisinopril (PRINIVIL,ZESTRIL) 5 MG tablet Take 5 mg by mouth daily.    [provider]  metFORMIN (GLUCOPHAGE-XR) 500 MG 24 hr tablet Take by mouth. 01/07/23   [provider]  NEEDLE, DISP, 18 G (BD DISP NEEDLES) 18G X 1-1/2 MISC 1 mg by Does not apply route every 14 (fourteen) days. 05/02/24   McGowan,  Clotilda A, PA-C  NEEDLE, DISP, 21 G (BD DISP NEEDLES) 21G X 1-1/2 MISC 1 mg by Does not apply route every 14 (fourteen) days. 05/02/24   Helon Clotilda A, PA-C  propranolol (INDERAL) 10 MG tablet Take by mouth. 09/23/17   [provider]  rosuvastatin (CRESTOR) 10 MG tablet Take 1 tablet by mouth daily. 01/07/23 04/06/24  [provider]  sildenafil (REVATIO) 20 MG tablet Take 3 to 5 tablets two hours before intercouse on an empty stomach.  Do not take with nitrates. 07/15/21   Helon Clotilda A, PA-C  SUMAtriptan (IMITREX) 50 MG tablet Take by mouth. 11/28/20   [provider]  Syringe, Disposable, (2-3CC SYRINGE) 3 ML MISC 1 mg by Does not apply route every 14 (fourteen) days. 05/02/24   Helon Clotilda A, PA-C  testosterone cypionate (DEPOTESTOSTERONE CYPIONATE) 200 MG/ML injection ADMINISTER 0.5 ML IN THE MUSCLE EVERY WEEK 05/02/24   McGowan, Clotilda A, PA-C  TRULICITY 1.5 MG/0.5ML SOAJ Inject 1.5 mg into the skin once a week. 04/28/24   [provider]  VASCEPA 1 g CAPS TK 2 CS PO BID 09/26/17   [provider]  vitamin B-12 (CYANOCOBALAMIN) 1000 MCG tablet Take by mouth.    [provider]  vitamin E 400 UNIT capsule Take 800 Units by mouth daily.    [provider]    Family History Family History  Problem Relation Age of Onset   Hypertension Mother    Alcohol abuse Mother    Diabetes Mother    Stroke Mother    Transient ischemic attack Father    Hyperlipidemia Father    Melanoma Father    Heart attack Father    Kidney cancer Neg Hx    Prostate cancer Neg Hx    Bladder Cancer Neg Hx     Social History Social History   Tobacco Use   Smoking status: Never    Passive exposure: Never   Smokeless tobacco: Former  Building services engineer status: Never Used  Substance Use Topics   Alcohol use: Yes    Alcohol/week: 0.0 standard drinks of alcohol    Comment: occasional   Drug use: No     Allergies   Codeine,  Fenofibrate, Fish oil, Niacin, Niacin and related, Prednisone, and Tricor [fenofibrate]   Review of Systems Review of Systems  Constitutional:  Negative for chills and fever.  Musculoskeletal:  Negative for myalgias, neck pain and neck stiffness.  Skin:  Positive for wound.  All other systems reviewed and are negative.    Physical Exam Triage Vital Signs ED Triage Vitals  Encounter Vitals Group     BP 08/06/24 1056 125/88     Girls Systolic BP Percentile --      Girls Diastolic BP Percentile --      Boys Systolic BP Percentile --      Boys Diastolic BP Percentile --  Pulse Rate 08/06/24 1056 89     Resp 08/06/24 1056 15     Temp 08/06/24 1056 98.2 F (36.8 C)     Temp Source 08/06/24 1056 Oral     SpO2 08/06/24 1056 97 %     Weight 08/06/24 1053 185 lb (83.9 kg)     Height 08/06/24 1053 5' 11 (1.803 m)     Head Circumference --      Peak Flow --      Pain Score 08/06/24 1053 7     Pain Loc --      Pain Education --      Exclude from Growth Chart --    No data found.  Updated Vital Signs BP 125/88 (BP Location: Left Arm)   Pulse 89   Temp 98.2 F (36.8 C) (Oral)   Resp 15   Ht 5' 11 (1.803 m)   Wt 185 lb (83.9 kg)   SpO2 97%   BMI 25.80 kg/m   Visual Acuity Right Eye Distance:   Left Eye Distance:   Bilateral Distance:    Right Eye Near:   Left Eye Near:    Bilateral Near:     Physical Exam Vitals reviewed.  Constitutional:      General: He is awake. He is not in acute distress.    Appearance: Normal appearance. He is well-developed. He is not ill-appearing, toxic-appearing or diaphoretic.  HENT:     Head: Normocephalic.     Right Ear: Hearing normal.     Left Ear: Hearing normal.     Nose: Nose normal.     Mouth/Throat:     Mouth: Mucous membranes are moist.  Eyes:     General: Vision grossly intact.     Conjunctiva/sclera: Conjunctivae normal.  Cardiovascular:     Rate and Rhythm: Normal rate and regular rhythm.     Heart sounds:  Normal heart sounds.  Pulmonary:     Effort: Pulmonary effort is normal.     Breath sounds: Normal breath sounds and air entry.  Musculoskeletal:        General: Normal range of motion.     Cervical back: Full passive range of motion without pain, normal range of motion and neck supple.  Lymphadenopathy:     Cervical: No cervical adenopathy.  Skin:    General: Skin is warm and dry.     Findings: Abscess present.     Comments: Erythematous abscess noted to the left lateral neck with surrounding erythema.  Abscess is firm without any underlying fluctuance.  See picture below.  Neurological:     General: No focal deficit present.     Mental Status: He is alert and oriented to person, place, and time.  Psychiatric:        Speech: Speech normal.        Behavior: Behavior is cooperative.      UC Treatments / Results  Labs (all labs ordered are listed, but only abnormal results are displayed) Labs Reviewed - No data to display  EKG   Radiology No results found.  Procedures Procedures (including critical care time)  Medications Ordered in UC Medications - No data to display  Initial Impression / Assessment and Plan / UC Course  I have reviewed the triage vital signs and the nursing notes.  Pertinent labs & imaging results that were available during my care of the patient were reviewed by me and considered in my medical decision making (see chart for details).  The patient presents with a boil-like lesion on the neck consistent with a recurrent MRSA-associated skin infection. The lesion is hard without fluctuance, suggesting it is not yet ready for incision and drainage. The patient has a history of similar MRSA-related lesions under the arms and nose, with prior variable antibiotic responses; the most recent course of doxycycline was effective. Doxycycline was prescribed, and the patient was instructed to apply warm, moist compresses using a wet washcloth and low-heat  heating pad to promote maturation. Education was provided on avoiding self-drainage, recognizing signs of abscess maturation, and proper wound care if spontaneous drainage occurs. The patient was advised to return in two days if there is no improvement for reassessment and possible drainage, and to follow up sooner or seek emergency care if symptoms rapidly worsen or systemic signs such as fever develop.   Today's evaluation has revealed no signs of a dangerous process. Discussed diagnosis with patient and/or guardian. Patient and/or guardian aware of their diagnosis, possible red flag symptoms to watch out for and need for close follow up. Patient and/or guardian understands verbal and written discharge instructions. Patient and/or guardian comfortable with plan and disposition.  Patient and/or guardian has a clear mental status at this time, good insight into illness (after discussion and teaching) and has clear judgment to make decisions regarding their care  Documentation was completed with the aid of voice recognition software. Transcription may contain typographical errors. Final Clinical Impressions(s) / UC Diagnoses   Final diagnoses:  Abscess of neck     Discharge Instructions      You were seen today for a boil-like lesion on your neck that is most consistent with a MRSA-related skin infection. At this time, the area is firm and not ready to be drained. You have been prescribed doxycycline to treat the infection. Take this medication exactly as directed and finish the entire course, even if the area begins to look better. Apply warm, moist compresses to the area several times a day using a wet washcloth and a low-heat heating pad to help bring the infection to a head. Do not try to squeeze, pop, or drain the lesion yourself, as this can make the infection worse or cause it to spread. If the area drains on its own, gently wash it with mild soap and water, apply a clean dressing, and wash your  hands afterward. Monitor for signs that the infection is getting worse, such as increasing redness, swelling, pain, warmth, pus drainage, fever, or chills. Follow up in two days if the area has not improved or sooner if symptoms worsen. Seek immediate medical attention if you develop high fever, rapid swelling, spreading redness, severe pain, or feel generally unwell.      ED Prescriptions     Medication Sig Dispense Auth. Provider   doxycycline (VIBRAMYCIN) 100 MG capsule Take 1 capsule (100 mg total) by mouth 2 (two) times daily. 14 capsule Iola Lukes, FNP      PDMP not reviewed this encounter.   Iola Lukes, [place] 08/06/24 1144

## 2024-08-10 ENCOUNTER — Other Ambulatory Visit
Admission: RE | Admit: 2024-08-10 | Discharge: 2024-08-10 | Disposition: A | Discharge status: Home / Self Care | Attending: Urology | Admitting: Urology

## 2024-08-10 DIAGNOSIS — E291 Testicular hypofunction: Secondary | ICD-10-CM | POA: Diagnosis present

## 2024-08-10 LAB — HEMOGLOBIN AND HEMATOCRIT, BLOOD
HCT: 47.6 % (ref 39.0–52.0)
Hemoglobin: 16.4 g/dL (ref 13.0–17.0)

## 2024-08-11 LAB — TESTOSTERONE: Testosterone: 554 ng/dL (ref 264–916)

## 2024-08-12 ENCOUNTER — Ambulatory Visit: Payer: Self-pay | Admitting: Urology

## 2024-08-15 ENCOUNTER — Ambulatory Visit: Payer: 59 | Admitting: Urology

## 2024-08-18 ENCOUNTER — Other Ambulatory Visit: Payer: Self-pay

## 2024-08-18 DIAGNOSIS — E291 Testicular hypofunction: Secondary | ICD-10-CM

## 2024-10-13 ENCOUNTER — Encounter: Payer: Self-pay | Admitting: Urology

## 2024-11-11 ENCOUNTER — Other Ambulatory Visit
Admission: RE | Admit: 2024-11-11 | Discharge: 2024-11-11 | Disposition: A | Discharge status: Home / Self Care | Attending: Urology | Admitting: Urology

## 2024-11-11 DIAGNOSIS — E291 Testicular hypofunction: Secondary | ICD-10-CM | POA: Diagnosis present

## 2024-11-11 LAB — HEMOGLOBIN AND HEMATOCRIT, BLOOD
HCT: 49.3 % (ref 39.0–52.0)
Hemoglobin: 16.8 g/dL (ref 13.0–17.0)

## 2024-11-13 LAB — TESTOSTERONE: Testosterone: 822 ng/dL (ref 264–916)

## 2024-11-13 NOTE — Progress Notes (Deleted)
 11/14/2024 9:06 PM   Jeffrey Cook. [birth year] [id number]  Referring provider: Delfina Pao, MD [address] [place],  [place] [address]  Urological history: 1.  High risk hematuria - non - smoker - CTU (2020) - punctate 1 to 2 mm stone for the inferior pole of the right kidney+ - cysto (2020) - NED - CTU (-03/2024) -bilateral punctate stones - cysto deferred, urine cytology negative   2.  Hypogonadism - Testosterone  level (10/2024) 822 - Hemoglobin/hematocrit (10/2024) 16.8/49.3 - Testosterone  cypionate 200 mg/milliliters, 0.5 cc every 7 days  3. BPH with LU TS - PSA (03/2024) 0.4  No chief complaint on file.  HPI: Jeffrey Cook. is a 58 y.o. man who presents today for follow up.    Previous records reviewed.   He reports good adherence to testosterone  200 mg/milliliters, 0.5 cc every 7 days.  Denies new complaints of low libido, erectile dysfunction, fatigue, or mood changes.  No complaints of gynecomastia, visual changes, or thromboembolic symptoms.  Energy level, libido and overall sense of wellbeing being reported as stable/ improved compared to prior visit.    Testosterone  level 822  Hemoglobin/hematocrit 16.8/49.3  Liver enzymes (12/2023) normal   I PSS ***  He reports sensation of incomplete bladder emptying,   urinary frequency,   urinary intermittency,   urinary urgency,   a weak urinary stream,   having to strain to void,   nocturia x ***,   leaking before being able to reach the restroom,   leaking with coughing,   leaking without awareness,   and post void dribbling.     He is wearing *** pads//depends  daily.    Patient denies any modifying or aggravating factors.  Patient denies any recent UTI's, gross hematuria, dysuria or suprapubic/flank pain.  Patient denies any fevers, chills, nausea or vomiting.  ***  He has a family history of PCa, colon cancer, ovarian cancer and/or breast cancer with ***.   He does not have a  family history of PCa, colon cancer, ovarian cancer, and/or breast cancer .***     UA (03/2024) negative   PSA (03/2024) 0.4  Serum creatinine (03/2024) 1.30  Hemoglobin A1c (05/2024) 6.1  SHIM ***  He does not have confidence that he could get and keep an erection, his erections are not firm enough for penetrative intercourse, he has difficulty maintaining his erections,  and he is not finding intercourse satisfactory for him.  ***  Patient still having spontaneous erections.  ***   He denies any pain or curvature with erections.    He is not able to ejaculate, has pain with ejaculation, and has blood in his ejaculate fluid.   ***  Testosterone  level ***  Cholesterol (05/2024) normal   PMH: Past Medical History:  Diagnosis Date   Allergic rhinitis    BPH with obstruction/lower urinary tract symptoms    Chronic headaches    Diabetes mellitus without complication (HCC)    Diverticulosis    Dyslipidemia    Erectile dysfunction    Fatty liver    GERD (gastroesophageal reflux disease)    Hypogonadism in male    Personal history of multiple concussions    Steatohepatitis, non-alcoholic     Surgical History: Past Surgical History:  Procedure Laterality Date   LIVER BIOPSY  06/10/2009   MYELOGRAM     NASAL SINUS SURGERY      Home Medications:  Allergies as of 11/14/2024       Reactions   Codeine  Fenofibrate Other (See Comments)   Fish Oil    Other reaction(s): Other (See Comments) Dyspepsia, foul taste, loose stools - tolerates Lovaza   Niacin    Other reaction(s): Liver Disorder He has a history of liver test elevations while taking niacin prior to 2006. No niacin trial since then. 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   Niacin And Related    Prednisone    Headache   Tricor [fenofibrate]         Medication List        Accurate as of November 13, 2024  9:06 PM. If you have any questions, ask your nurse or doctor.          2-3CC  SYRINGE 3 ML Misc 1 mg by Does not apply route every 14 (fourteen) days.   aspirin 81 MG tablet Take 81 mg by mouth daily.   BD Disp Needles 18G X 1-1/2 Misc Generic drug: NEEDLE (DISP) 18 G 1 mg by Does not apply route every 14 (fourteen) days.   BD Disp Needles 21G X 1-1/2 Misc Generic drug: NEEDLE (DISP) 21 G 1 mg by Does not apply route every 14 (fourteen) days.   buPROPion 200 MG 12 hr tablet Commonly known as: WELLBUTRIN SR Take 200 mg by mouth 2 (two) times daily.   cholecalciferol 10 MCG (400 UNIT) Tabs tablet Commonly known as: VITAMIN D3 Take 1,000 Units by mouth daily.   cyanocobalamin 1000 MCG tablet Commonly known as: VITAMIN B12 Take by mouth.   doxycycline  100 MG capsule Commonly known as: VIBRAMYCIN  Take 1 capsule (100 mg total) by mouth 2 (two) times daily.   fenofibrate 160 MG tablet Take 160 mg by mouth daily.   FreeStyle Libre 3 Sensor Misc USE AS DIRECTED EVERY 14 DAYS   lisinopril 5 MG tablet Commonly known as: ZESTRIL Take 5 mg by mouth daily.   metFORMIN 500 MG 24 hr tablet Commonly known as: GLUCOPHAGE-XR Take by mouth.   propranolol 10 MG tablet Commonly known as: INDERAL Take by mouth.   rosuvastatin 10 MG tablet Commonly known as: CRESTOR Take 1 tablet by mouth daily.   sildenafil  20 MG tablet Commonly known as: REVATIO  Take 3 to 5 tablets two hours before intercouse on an empty stomach.  Do not take with nitrates.   SUMAtriptan 50 MG tablet Commonly known as: IMITREX Take by mouth.   testosterone  cypionate 200 MG/ML injection Commonly known as: DEPOTESTOSTERONE CYPIONATE ADMINISTER 0.5 ML IN THE MUSCLE EVERY WEEK   Trulicity 1.5 MG/0.5ML Soaj Generic drug: Dulaglutide Inject 1.5 mg into the skin once a week.   Vascepa 1 g capsule Generic drug: icosapent Ethyl TK 2 CS PO BID   vitamin E 180 MG (400 UNITS) capsule Take 800 Units by mouth daily.        Allergies:  Allergies  Allergen Reactions   Codeine     Fenofibrate Other (See Comments)   Fish Oil     Other reaction(s): Other (See Comments) Dyspepsia, foul taste, loose stools - tolerates Lovaza   Niacin     Other reaction(s): Liver Disorder He has a history of liver test elevations while taking niacin prior to 2006. No niacin trial since then. 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   Niacin And Related    Prednisone     Headache   Tricor [Fenofibrate]     Family History: Family History  Problem Relation Age of Onset   Hypertension Mother  Alcohol abuse Mother    Diabetes Mother    Stroke Mother    Transient ischemic attack Father    Hyperlipidemia Father    Melanoma Father    Heart attack Father    Kidney cancer Neg Hx    Prostate cancer Neg Hx    Bladder Cancer Neg Hx     Social History:  reports that he has never smoked. He has never been exposed to tobacco smoke. He has quit using smokeless tobacco. He reports current alcohol use. He reports that he does not use drugs.  ROS: Pertinent ROS in HPI  Physical Exam: There were no vitals taken for this visit.  Constitutional:  Well nourished. Alert and oriented, No acute distress. HEENT: [place] AT, moist mucus membranes.  Trachea midline, no masses. Cardiovascular: No clubbing, cyanosis, or edema. Respiratory: Normal respiratory effort, no increased work of breathing. GI: Abdomen is soft, non tender, non distended, no abdominal masses. Liver and spleen not palpable.  No hernias appreciated.  Stool sample for occult testing is not indicated.   GU: No CVA tenderness.  No bladder fullness or masses.  Patient with circumcised/uncircumcised phallus. ***Foreskin easily retracted***  Urethral meatus is patent.  No penile discharge. No penile lesions or rashes. Scrotum without lesions, cysts, rashes and/or edema.  Testicles are located scrotally bilaterally. No masses are appreciated in the testicles. Left and right epididymis are normal. Rectal: Patient with  normal  sphincter tone. Anus and perineum without scarring or rashes. No rectal masses are appreciated. Prostate is approximately *** grams, *** nodules are appreciated. Seminal vesicles are normal. Skin: No rashes, bruises or suspicious lesions. Lymph: No cervical or inguinal adenopathy. Neurologic: Grossly intact, no focal deficits, moving all 4 extremities. Psychiatric: Normal mood and affect.   Laboratory Data: See EPIC and HPI  I have reviewed the labs.   Pertinent Imaging: N/A  Assessment & Plan:    1. High risk hematuria - Non-smoker - CTU x 2 - demonstrated punctate stones - cysto x 1 - NED - Urine cytology negative for malignancy - no report of gross heme  2.  Hypogonadism - Serum testosterone  is therapeutic - Hemoglobin/hematocrit normal - Continue testosterone  cypionate 200 mg/milliliters, 0.5 cc every 7 days - We will repeat his testosterone , hemoglobin and hematocrit levels in 3 months have asked for him to he has blood drawn about 4 days after an injection  3. BPH with LU TS - PSA stable  4. ED - At goal on testosterone  therapy   No follow-ups on file.  These notes generated with voice recognition software. I apologize for typographical errors.  CLOTILDA HELON RIGGERS  [facility] [address]  [address] [place], [place] [address] [phone]

## 2024-11-14 ENCOUNTER — Ambulatory Visit: Admitting: Urology

## 2024-11-14 VITALS — BP 126/84 | HR 87 | Wt 185.0 lb

## 2024-11-14 DIAGNOSIS — N529 Male erectile dysfunction, unspecified: Secondary | ICD-10-CM

## 2024-11-14 DIAGNOSIS — E291 Testicular hypofunction: Secondary | ICD-10-CM

## 2024-11-14 DIAGNOSIS — N138 Other obstructive and reflux uropathy: Secondary | ICD-10-CM

## 2024-11-14 DIAGNOSIS — R319 Hematuria, unspecified: Secondary | ICD-10-CM | POA: Diagnosis not present

## 2024-11-14 DIAGNOSIS — N401 Enlarged prostate with lower urinary tract symptoms: Secondary | ICD-10-CM | POA: Diagnosis not present

## 2024-11-14 MED ORDER — TESTOSTERONE CYPIONATE 200 MG/ML IM SOLN: INTRAMUSCULAR | 0 refills | Status: AC

## 2024-11-14 MED ORDER — SILDENAFIL CITRATE 20 MG PO TABS: ORAL_TABLET | ORAL | 3 refills | Status: AC

## 2024-11-14 NOTE — Progress Notes (Unsigned)
 11/14/2024 3:50 PM   [name]. 22-Feb-1966 [id number]  Referring provider: Delfina Pao, MD [address] [place],  [place] [address]  Urological history: 1.  High risk hematuria - non - smoker - CTU (2020) - punctate 1 to 2 mm stone for the inferior pole of the right kidney+ - cysto (2020) - NED - CTU (-03/2024) -bilateral punctate stones - cysto deferred, urine cytology negative   2.  Hypogonadism - Testosterone  level (10/2024) 822 - Hemoglobin/hematocrit (10/2024) 16.8/49.3 - Testosterone  cypionate 200 mg/milliliters, 0.5 cc every 7 days  3. BPH with LU TS - PSA (03/2024) 0.4  Chief Complaint  Patient presents with   Benign Prostatic Hypertrophy   HPI: Jeffrey Cook. is a 58 y.o. man who presents today for follow up.    Previous records reviewed.   He reports good adherence to testosterone  200 mg/milliliters, 0.5 cc every 7 days.  Denies new complaints of low libido, erectile dysfunction, fatigue, or mood changes.  No complaints of gynecomastia, visual changes, or thromboembolic symptoms.  Energy level, libido and overall sense of wellbeing being reported as stable/ improved compared to prior visit.    Testosterone  level 822  Hemoglobin/hematocrit 16.8/49.3  Liver enzymes (12/2023) normal   I PSS 4/2  He reports urinary frequency, urinary urgency, nocturia x 1.     leaking before being able to reach the restroom,   leaking with coughing,   leaking without awareness,   and post void dribbling.     He is wearing *** pads//depends  daily.    Patient denies any modifying or aggravating factors.  Patient denies any recent UTI's, gross hematuria, dysuria or suprapubic/flank pain.  Patient denies any fevers, chills, nausea or vomiting.  ***  He has a family history of PCa, colon cancer, ovarian cancer and/or breast cancer with ***.   He does not have a family history of PCa, colon cancer, ovarian cancer, and/or breast cancer .***     UA  (03/2024) negative   PSA (03/2024) 0.4  Serum creatinine (03/2024) 1.30  Hemoglobin A1c (05/2024) 6.1  SHIM 25  He does not have confidence that he could get and keep an erection, his erections are not firm enough for penetrative intercourse, he has difficulty maintaining his erections,  and he is not finding intercourse satisfactory for him.  ***  Patient still having spontaneous erections.  ***   He denies any pain or curvature with erections.    He is not able to ejaculate, has pain with ejaculation, and has blood in his ejaculate fluid.   ***  Testosterone  level ***  Cholesterol (05/2024) normal   PMH: Past Medical History:  Diagnosis Date   Allergic rhinitis    BPH with obstruction/lower urinary tract symptoms    Chronic headaches    Diabetes mellitus without complication (HCC)    Diverticulosis    Dyslipidemia    Erectile dysfunction    Fatty liver    GERD (gastroesophageal reflux disease)    Hypogonadism in male    Personal history of multiple concussions    Steatohepatitis, non-alcoholic     Surgical History: Past Surgical History:  Procedure Laterality Date   LIVER BIOPSY  06/10/2009   MYELOGRAM     NASAL SINUS SURGERY      Home Medications:  Allergies as of 11/14/2024       Reactions   Codeine    Fenofibrate Other (See Comments)   Fish Oil    Other reaction(s): Other (See Comments)  Dyspepsia, foul taste, loose stools - tolerates Lovaza   Niacin    Other reaction(s): Liver Disorder He has a history of liver test elevations while taking niacin prior to 2006. No niacin trial since then. 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   Niacin And Related    Prednisone    Headache   Tricor [fenofibrate]         Medication List        Accurate as of November 14, 2024  3:50 PM. If you have any questions, ask your nurse or doctor.          2-3CC SYRINGE 3 ML Misc 1 mg by Does not apply route every 14 (fourteen) days.   aspirin 81  MG tablet Take 81 mg by mouth daily.   BD Disp Needles 18G X 1-1/2 Misc Generic drug: NEEDLE (DISP) 18 G 1 mg by Does not apply route every 14 (fourteen) days.   BD Disp Needles 21G X 1-1/2 Misc Generic drug: NEEDLE (DISP) 21 G 1 mg by Does not apply route every 14 (fourteen) days.   buPROPion 200 MG 12 hr tablet Commonly known as: WELLBUTRIN SR Take 200 mg by mouth 2 (two) times daily.   cholecalciferol 10 MCG (400 UNIT) Tabs tablet Commonly known as: VITAMIN D3 Take 1,000 Units by mouth daily.   cyanocobalamin 1000 MCG tablet Commonly known as: VITAMIN B12 Take by mouth.   doxycycline  100 MG capsule Commonly known as: VIBRAMYCIN  Take 1 capsule (100 mg total) by mouth 2 (two) times daily.   fenofibrate 160 MG tablet Take 160 mg by mouth daily.   FreeStyle Libre 3 Sensor Misc USE AS DIRECTED EVERY 14 DAYS   lisinopril 5 MG tablet Commonly known as: ZESTRIL Take 5 mg by mouth daily.   metFORMIN 500 MG 24 hr tablet Commonly known as: GLUCOPHAGE-XR Take by mouth.   propranolol 10 MG tablet Commonly known as: INDERAL Take by mouth.   rosuvastatin 10 MG tablet Commonly known as: CRESTOR Take 1 tablet by mouth daily.   sildenafil  20 MG tablet Commonly known as: REVATIO  Take 3 to 5 tablets two hours before intercouse on an empty stomach.  Do not take with nitrates.   SUMAtriptan 50 MG tablet Commonly known as: IMITREX Take by mouth.   testosterone  cypionate 200 MG/ML injection Commonly known as: DEPOTESTOSTERONE CYPIONATE ADMINISTER 0.5 ML IN THE MUSCLE EVERY WEEK   Trulicity 1.5 MG/0.5ML Soaj Generic drug: Dulaglutide Inject 1.5 mg into the skin once a week.   Vascepa 1 g capsule Generic drug: icosapent Ethyl TK 2 CS PO BID   vitamin E 180 MG (400 UNITS) capsule Take 800 Units by mouth daily.        Allergies:  Allergies  Allergen Reactions   Codeine    Fenofibrate Other (See Comments)   Fish Oil     Other reaction(s): Other (See  Comments) Dyspepsia, foul taste, loose stools - tolerates Lovaza   Niacin     Other reaction(s): Liver Disorder He has a history of liver test elevations while taking niacin prior to 2006. No niacin trial since then. 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   Niacin And Related    Prednisone     Headache   Tricor [Fenofibrate]     Family History: Family History  Problem Relation Age of Onset   Hypertension Mother    Alcohol abuse Mother    Diabetes Mother    Stroke Mother  Transient ischemic attack Father    Hyperlipidemia Father    Melanoma Father    Heart attack Father    Kidney cancer Neg Hx    Prostate cancer Neg Hx    Bladder Cancer Neg Hx     Social History:  reports that he has never smoked. He has never been exposed to tobacco smoke. He has quit using smokeless tobacco. He reports current alcohol use. He reports that he does not use drugs.  ROS: Pertinent ROS in HPI  Physical Exam: BP 126/84 (BP Location: Left Arm, Patient Position: Sitting, Cuff Size: Normal)   Pulse 87   Wt 185 lb (83.9 kg)   SpO2 96%   BMI 25.80 kg/m   Constitutional:  Well nourished. Alert and oriented, No acute distress. HEENT: [place] AT, moist mucus membranes.  Trachea midline, no masses. Cardiovascular: No clubbing, cyanosis, or edema. Respiratory: Normal respiratory effort, no increased work of breathing. GI: Abdomen is soft, non tender, non distended, no abdominal masses. Liver and spleen not palpable.  No hernias appreciated.  Stool sample for occult testing is not indicated.   GU: No CVA tenderness.  No bladder fullness or masses.  Patient with circumcised/uncircumcised phallus. ***Foreskin easily retracted***  Urethral meatus is patent.  No penile discharge. No penile lesions or rashes. Scrotum without lesions, cysts, rashes and/or edema.  Testicles are located scrotally bilaterally. No masses are appreciated in the testicles. Left and right epididymis are normal. Rectal:  Patient with  normal sphincter tone. Anus and perineum without scarring or rashes. No rectal masses are appreciated. Prostate is approximately *** grams, *** nodules are appreciated. Seminal vesicles are normal. Skin: No rashes, bruises or suspicious lesions. Lymph: No cervical or inguinal adenopathy. Neurologic: Grossly intact, no focal deficits, moving all 4 extremities. Psychiatric: Normal mood and affect.   Laboratory Data: See EPIC and HPI  I have reviewed the labs.   Pertinent Imaging: N/A  Assessment & Plan:    1. High risk hematuria - Non-smoker - CTU x 2 - demonstrated punctate stones - cysto x 1 - NED - Urine cytology negative for malignancy - no report of gross heme  2.  Hypogonadism - Serum testosterone  is therapeutic - Hemoglobin/hematocrit normal - Continue testosterone  cypionate 200 mg/milliliters, 0.5 cc every 7 days - We will repeat his testosterone , hemoglobin and hematocrit levels in 3 months have asked for him to he has blood drawn about 4 days after an injection  3. BPH with LU TS - PSA stable  4. ED - At goal on testosterone  therapy   No follow-ups on file.  These notes generated with voice recognition software. I apologize for typographical errors.  CLOTILDA HELON RIGGERS  [facility] [address]  [address] [place], [place] [address] [phone]

## 2024-11-16 ENCOUNTER — Encounter: Payer: Self-pay | Admitting: Urology

## 2024-11-21 ENCOUNTER — Telehealth: Payer: Self-pay

## 2024-11-21 NOTE — Telephone Encounter (Signed)
 Started a prior authorization of testosterone  on covermymeds

## 2025-02-14 ENCOUNTER — Other Ambulatory Visit
